# Patient Record
Sex: Female | Born: 1965
Health system: Southern US, Community
[De-identification: ages and names within clinical notes are randomized; demographics above are authoritative.]

## PROBLEM LIST (undated history)

## (undated) DIAGNOSIS — R87619 Unspecified abnormal cytological findings in specimens from cervix uteri: Secondary | ICD-10-CM

## (undated) DIAGNOSIS — F419 Anxiety disorder, unspecified: Secondary | ICD-10-CM

## (undated) DIAGNOSIS — Z8616 Personal history of COVID-19: Secondary | ICD-10-CM

## (undated) DIAGNOSIS — R7309 Other abnormal glucose: Secondary | ICD-10-CM

## (undated) DIAGNOSIS — B002 Herpesviral gingivostomatitis and pharyngotonsillitis: Secondary | ICD-10-CM

## (undated) DIAGNOSIS — IMO0002 Reserved for concepts with insufficient information to code with codable children: Secondary | ICD-10-CM

## (undated) DIAGNOSIS — E7800 Pure hypercholesterolemia, unspecified: Secondary | ICD-10-CM

## (undated) HISTORY — DX: Unspecified abnormal cytological findings in specimens from cervix uteri: R87.619

## (undated) HISTORY — DX: Pure hypercholesterolemia, unspecified: E78.00

## (undated) HISTORY — DX: Anxiety disorder, unspecified: F41.9

## (undated) HISTORY — PX: GYNECOLOGIC CRYOSURGERY: SHX857

## (undated) HISTORY — DX: Reserved for concepts with insufficient information to code with codable children: IMO0002

## (undated) HISTORY — DX: Other abnormal glucose: R73.09

## (undated) HISTORY — DX: Personal history of COVID-19: Z86.16

## (undated) HISTORY — PX: ECTOPIC PREGNANCY SURGERY: SHX613

## (undated) HISTORY — DX: Herpesviral gingivostomatitis and pharyngotonsillitis: B00.2

---

## 1998-06-09 ENCOUNTER — Other Ambulatory Visit: Admission: RE | Admit: 1998-06-09 | Discharge: 1998-06-09 | Payer: Self-pay | Admitting: *Deleted

## 1998-06-10 ENCOUNTER — Encounter: Admission: RE | Admit: 1998-06-10 | Discharge: 1998-09-08 | Payer: Self-pay | Admitting: *Deleted

## 1998-07-20 ENCOUNTER — Inpatient Hospital Stay (HOSPITAL_COMMUNITY): Admission: AD | Admit: 1998-07-20 | Discharge: 1998-07-23 | Payer: Self-pay | Admitting: *Deleted

## 1998-07-25 ENCOUNTER — Encounter (HOSPITAL_COMMUNITY): Admission: RE | Admit: 1998-07-25 | Discharge: 1998-10-23 | Payer: Self-pay | Admitting: *Deleted

## 1998-08-11 ENCOUNTER — Other Ambulatory Visit: Admission: RE | Admit: 1998-08-11 | Discharge: 1998-08-11 | Payer: Self-pay | Admitting: *Deleted

## 1999-09-06 ENCOUNTER — Other Ambulatory Visit: Admission: RE | Admit: 1999-09-06 | Discharge: 1999-09-06 | Payer: Self-pay | Admitting: Obstetrics and Gynecology

## 1999-11-21 HISTORY — PX: PELVIC LAPAROSCOPY: SHX162

## 2000-05-24 ENCOUNTER — Encounter (INDEPENDENT_AMBULATORY_CARE_PROVIDER_SITE_OTHER): Payer: Self-pay

## 2000-05-24 ENCOUNTER — Ambulatory Visit (HOSPITAL_COMMUNITY): Admission: AD | Admit: 2000-05-24 | Discharge: 2000-05-24 | Payer: Self-pay | Admitting: Obstetrics and Gynecology

## 2000-05-24 ENCOUNTER — Encounter: Payer: Self-pay | Admitting: Obstetrics and Gynecology

## 2000-11-20 HISTORY — PX: TUBAL LIGATION: SHX77

## 2000-11-27 ENCOUNTER — Other Ambulatory Visit: Admission: RE | Admit: 2000-11-27 | Discharge: 2000-11-27 | Payer: Self-pay | Admitting: Obstetrics and Gynecology

## 2001-10-15 ENCOUNTER — Encounter (INDEPENDENT_AMBULATORY_CARE_PROVIDER_SITE_OTHER): Payer: Self-pay

## 2001-10-15 ENCOUNTER — Inpatient Hospital Stay (HOSPITAL_COMMUNITY): Admission: AD | Admit: 2001-10-15 | Discharge: 2001-10-18 | Payer: Self-pay | Admitting: Obstetrics and Gynecology

## 2003-04-09 ENCOUNTER — Other Ambulatory Visit: Admission: RE | Admit: 2003-04-09 | Discharge: 2003-04-09 | Payer: Self-pay | Admitting: Obstetrics and Gynecology

## 2004-04-21 ENCOUNTER — Other Ambulatory Visit: Admission: RE | Admit: 2004-04-21 | Discharge: 2004-04-21 | Payer: Self-pay | Admitting: Obstetrics and Gynecology

## 2004-05-30 ENCOUNTER — Encounter: Admission: RE | Admit: 2004-05-30 | Discharge: 2004-05-30 | Payer: Self-pay | Admitting: Obstetrics and Gynecology

## 2005-05-09 ENCOUNTER — Other Ambulatory Visit: Admission: RE | Admit: 2005-05-09 | Discharge: 2005-05-09 | Payer: Self-pay | Admitting: Obstetrics and Gynecology

## 2005-06-28 ENCOUNTER — Encounter: Admission: RE | Admit: 2005-06-28 | Discharge: 2005-06-28 | Payer: Self-pay | Admitting: Obstetrics and Gynecology

## 2006-06-27 ENCOUNTER — Other Ambulatory Visit: Admission: RE | Admit: 2006-06-27 | Discharge: 2006-06-27 | Payer: Self-pay | Admitting: Obstetrics & Gynecology

## 2006-06-29 ENCOUNTER — Encounter: Admission: RE | Admit: 2006-06-29 | Discharge: 2006-06-29 | Payer: Self-pay | Admitting: Obstetrics and Gynecology

## 2007-01-01 ENCOUNTER — Ambulatory Visit: Payer: Self-pay | Admitting: Family Medicine

## 2007-07-17 ENCOUNTER — Other Ambulatory Visit: Admission: RE | Admit: 2007-07-17 | Discharge: 2007-07-17 | Payer: Self-pay | Admitting: Obstetrics and Gynecology

## 2007-07-31 ENCOUNTER — Encounter: Admission: RE | Admit: 2007-07-31 | Discharge: 2007-07-31 | Payer: Self-pay | Admitting: Obstetrics and Gynecology

## 2008-08-05 ENCOUNTER — Other Ambulatory Visit: Admission: RE | Admit: 2008-08-05 | Discharge: 2008-08-05 | Payer: Self-pay | Admitting: Obstetrics and Gynecology

## 2008-09-24 ENCOUNTER — Encounter: Admission: RE | Admit: 2008-09-24 | Discharge: 2008-09-24 | Payer: Self-pay | Admitting: Obstetrics and Gynecology

## 2009-10-19 ENCOUNTER — Encounter: Admission: RE | Admit: 2009-10-19 | Discharge: 2009-10-19 | Payer: Self-pay | Admitting: Obstetrics and Gynecology

## 2010-12-11 ENCOUNTER — Encounter: Payer: Self-pay | Admitting: Obstetrics and Gynecology

## 2010-12-21 ENCOUNTER — Encounter: Payer: Self-pay | Admitting: Obstetrics and Gynecology

## 2011-04-07 NOTE — Op Note (Signed)
[facility]  Patient:    Abigail Sawyer, Abigail Sawyer                       MRN: [id number] Proc. Date: 05/24/00 Adm. Date:  [id number] Attending:  Maxie Better                           Operative Report  PREOPERATIVE DIAGNOSIS:       Ruptured right ectopic pregnancy.  POSTOPERATIVE DIAGNOSIS:      Ruptured right ectopic pregnancy.  OPERATION:                    Operative laparoscopy.                               Distal right salpingectomy.  SURGEON:                      Sheronette A. Cherly Hensen, M.D.  ASSISTANT:                    Cordelia Pen A. Rosalio Macadamia, M.D.  ANESTHESIA:                   General.  INDICATIONS: This is a 45 year old gravida 3, para 1, female who presented with complaints of right lower quadrant pain and a positive pregnancy test. On evaluation, she was found to have fluid in the abdomen and an hCG of greater than 10,000 without an intrauterine pregnancy. Diagnosis of a presumptive ruptured ectopic was made. Consent was signed. The patient was transferred to the operating room for laparoscopy and possible laparotomy with possible loss of her involved tube. The risks and benefits of the procedure have been explained to the patient and her husband. Consent was signed.  DESCRIPTION OF PROCEDURE: Under adequate general anesthesia, the patient was placed in the dorsal lithotomy position. She was sterilely prepped and draped in the usual sterile fashion. An indwelling Foley catheter was placed. A bivalve speculum was placed in the vagina. An apparatus to manipulate the uterus was attached to the cervix. The speculum was removed.  Attention was then turned to the abdomen and the infraumbilical incision was made. The Veress needle as introduced into the abdomen. Opening pressure of 4 was noted. Normal saline was used to check for placement. Then 3.7 L of CO2 was insufflated. The Veress needle was removed. A 10/11 mm disposable trocar with sleeve  was introduced into the abdominal cavity without difficulty. The trocar was replaced by a lighted video laparoscope. Panoramic view of the pelvis notable for blood in the pelvis. Two additional ports were then placed. A 10 mm port was placed in the right lower quadrant and just a little bit off to the left of the midline, a 5 mm port was introduced. Through the lower ports, the large suction apparatus was then used to remove the clotted material, at which time the patient was placed in the Trendelenburg position and with manipulation of the vaginal apparatus, better visualization was noted of the pelvic contents. With the use of a probe through the left lower quadrant, the pelvis was inspected. The left tube and ovary were noted to be normal. The right ovary was noted to be normal. The distal two-thirds of the fallopian tube was distended and even more so of the distal end of the tube consistent with an ectopic  pregnancy. The inspection of the upper abdomen showed a normal liver edge. After much consideration, decision was then made to remove the distal portion of the involved tube and with a tripolar cautery, as well as the Klepinger, the distal portion of the fallopian tube containing the ectopic pregnancy was removed. The remaining edge from which the tube was removed was cauterized. Good hemostasis was noted. Using an endobag, the products of conception within the tube, as well as the tube were removed through the right lower quadrant incision. Reinspection of the pelvic content and additional suction showed good hemostasis. The appendix was then identified and noted to be normal. With good hemostasis noted, the uterus was also further inspected. A question of fibroids posteriorly, but otherwise unremarkable uterus. The lower ports were removed. The abdomen was desufflated. The infraumbilical port was then removed, taking care not to bring up any underlying structure. Then 0.25% plain  Marcaine was injected in the skin over the incision. The 10 mm ports fascia area was closed with figure-of-eight sutures of 0 Vicryl. The skin was approximated using 4-0 Vicryl.  Attention was turned to the vagina where the apparatus were removed. The specimen of right tube with ectopic pregnancy was sent to pathology. The estimated blood loss was 50 cc. The hemoperitoneum was 350 cc. Intraoperative fluid was 600 cc of crystalloid. Urine output was 100 cc. Instrument count x 2 was correct. No complications. Maternal blood type is A+. The patient tolerated the procedure well. Transferred to the recovery room in stable condition. DD:  05/24/00 TD:  05/25/00 Job: [id number]

## 2011-04-07 NOTE — Op Note (Signed)
[facility]  Patient:    Abigail Sawyer, Abigail Sawyer Visit Number: [id number] MRN: [id number]          Service Type: OBS Location: 910A 9138 [id number] Attending Physician:  Lenoard Aden Dictated by:   Lenoard Aden, M.D. Proc. Date: 10/16/01 Admit Date:  10/15/2001                             Operative Report  PREOPERATIVE DIAGNOSES:       Failure to descend, 39 week intrauterine pregnancy, presumed macrosomia, desire for elective sterilization.  POSTOPERATIVE DIAGNOSES:      Failure to descend, 39 week intrauterine pregnancy, presumed macrosomia, desire for elective sterilization.  PROCEDURE:                    Primary low segment transverse cesarean section, left partial salpingectomy.  SURGEON:                      Lenoard Aden, M.D.  ASSISTANT:                    Cordelia Pen A. Rosalio Macadamia, M.D.  ESTIMATED BLOOD LOSS:         1000 cc.  ANESTHESIA:                   Epidural by Lonon.  COMPLICATIONS:                None.  FINDINGS:                     Full-term living female.  Apgars 8 and 9. Placenta manually intact.  Three vessel cord.  No complications.  Patient to recovery in good condition.  All counts correct.  OPERATIVE NOTE:               After being apprised of the risks of anesthesia, infection, bleeding, injury to abdominal organs with need for repair, patient was brought to the operating room where she was administered dosing of her epidural anesthetic without complications.  Prepped and draped in the usual sterile fashion.  Foley catheter placed.  After achieving adequate anesthesia and apprising the patient of failure risks of tubal ligation of 5-10 per 1000 she is brought to the operating room as noted.  After achieving adequate anesthesia, Pfannenstiel skin incision made with the scalpel, carried down to the fascia which was nicked in the midline, opened transversely using Mayo scissors.  Rectus muscles dissected sharply in the  midline.  Bladder blade placed.  Visceroperitoneum scored in a smile like fashion.  Uterus scored in a smile like fashion.  Atraumatic delivery of an 8 pound 15 ounce female handed to pediatricians in attendance.  Apgars 8 and 9.  Placenta delivered manually intact.  Three vessel cord noted.  Uterus exteriorized.  Absent right tube. Normal left tube.  Normal ovaries noted.  At this time the uterus was curetted using a dry lap pack and closed using 0 Monocryl in a continuous running fashion.  Second embrocating layer placed.  Small left lateral hematoma controlled using the Monocryl suture.  No expansion noted.  At this time the bladder flap was inspected and found to be hemostatic.  Left tube was traced out to the fimbriated end, grasped in the ampullary isthmic portion. Avascular portion of the mesosalpinx is cauterized.  Plain ties placed distally and proximally.  Tubal segment excised.  Tubal lumens are cauterized and visualized.  Good hemostasis is noted.  At this time the uterus is reinspected.  Hemostasis along the incision is noted.  Irrigation is accomplished.  Uterus is replaced in the abdominal cavity where reinspection reveals good hemostasis.  At this time pericolic gutters irrigated.  All blood clots subsequently removed.  Bladder flap inspected.  Rectus muscles inspected.  Fascia closed using 0 Vicryl in a continuous running fashion. Skin closed using staples.  Dilute Marcaine solution placed 10 cc.  Patient tolerates the procedure well.  To recovery in good condition. Dictated by:   Lenoard Aden, M.D. Attending Physician:  Lenoard Aden DD:  10/16/01 TD:  10/16/01 Job: [id number]

## 2011-04-07 NOTE — Discharge Summary (Signed)
[facility]  Patient:    Abigail Sawyer, Abigail Sawyer Visit Number: [id number] MRN: [id number]          Service Type: OBS Location: 910A 9138 01 Attending Physician:  Lenoard Aden Dictated by:   Lenoard Aden, M.D. Admit Date:  10/15/2001 Discharge Date: 10/18/2001                             Discharge Summary  HOSPITAL COURSE:              The patient underwent uncomplicated primary low segment transverse cesarean section for failure to descend with cesarean section and left salpingectomy done on October 16, 2001.  Desire for elective sterilization noted.  Postoperative course uncomplicated.  Hemoglobin of 8.0. Tolerated a regular diet well and remained afebrile.  Discharged home on day three.  DISCHARGE MEDICATIONS:        Iron and prenatal vitamins.  The patient to                               take iron b.i.d.  Motrin and Tylox were                               given.  DISCHARGE INSTRUCTIONS:       Discharge teaching done.  Incision care                               discussed.  FOLLOW-UP:                    In the office in four to six weeks. Dictated by:   Lenoard Aden, M.D. Attending Physician:  Lenoard Aden DD:  10/30/01 TD:  10/31/01 Job: [id number]

## 2011-04-07 NOTE — H&P (Signed)
[facility]  Patient:    Abigail Sawyer, Abigail Sawyer Visit Number: [id number] MRN: [id number]          Service Type: Attending:  Lenoard Aden, M.D. Dictated by:   Lenoard Aden, M.D. Adm. Date:  10/15/01                           History and Physical  CHIEF COMPLAINT:              Presumed macrosomia, with polyhydramnios.  BRIEF MEDICAL HISTORY:        The patient is a 45 year old white female, G4, P1, with an EDD of October 18, 2001 at 39+ weeks; for induction for the aforementioned indications.  PAST MEDICAL HISTORY:         1. Spontaneous pregnancy loss 1998.                               2. An 8 pound 6 ounce female delivered 1999.                               3. Right ectopic with rupture and distal right                                  salpingectomy in July 2001.                               4. Pyelonephritis at age 40.  FAMILY HISTORY:               Bone cancer, depression and hypertension.  PAST SURGICAL HISTORY:        Cryosurgery 1991.  PRENATAL LAB DATA:            Blood type A positive.  Rh antibody negative. Rubella immune.  Hepatitis B surface antigen negative.  HIV nonreactive.  GC and chlamydia negative.  GBS positive.  PRENATAL COURSE:              Complicated by preterm cervical change, advanced maternal age; with normal amniocentesis with polyhydramnios with presumed macrosomia.  PHYSICAL EXAMINATION:  GENERAL:                      She is a well-developed, well-nourished white female; in no apparent distress.  HEENT:                        Normal.  LUNGS:                        Clear.  HEART:                        Regular rate and rhythm.  ABDOMEN:                      Soft, gravid, nontender.  Estimated fetal weight 8.5-9 pounds.  GENITOURINARY:                Cervix 2-3 cm dilated, 50% vertex, minus 2. Artificial rupture of membranes clear.  EXTREMITIES:  No cords.  NEUROLOGIC:                    Nonfocal.  IMPRESSION:                   Term intrauterine pregnancy, with estimated fetal weight greater than 90th percentile and mild polyhydramnios.  PLAN:                         To proceed with Pitocin induction. Dictated by:   Lenoard Aden, M.D. Attending:  Lenoard Aden, M.D. DD:  10/15/01 TD:  10/15/01 Job: [id number]

## 2011-10-03 ENCOUNTER — Ambulatory Visit
Admission: RE | Admit: 2011-10-03 | Discharge: 2011-10-03 | Disposition: A | Discharge status: Home / Self Care | Payer: 59 | Source: Ambulatory Visit | Attending: Obstetrics and Gynecology | Admitting: Obstetrics and Gynecology

## 2011-10-03 ENCOUNTER — Other Ambulatory Visit: Payer: Self-pay | Admitting: Obstetrics and Gynecology

## 2011-10-03 ENCOUNTER — Ambulatory Visit: Payer: Self-pay

## 2011-10-03 DIAGNOSIS — Z1231 Encounter for screening mammogram for malignant neoplasm of breast: Secondary | ICD-10-CM

## 2012-10-08 LAB — HM PAP SMEAR: HM Pap smear: NEGATIVE

## 2012-11-07 ENCOUNTER — Other Ambulatory Visit: Payer: Self-pay | Admitting: Obstetrics and Gynecology

## 2012-11-07 DIAGNOSIS — Z1231 Encounter for screening mammogram for malignant neoplasm of breast: Secondary | ICD-10-CM

## 2012-12-13 ENCOUNTER — Ambulatory Visit: Payer: 59

## 2013-08-19 ENCOUNTER — Other Ambulatory Visit: Payer: Self-pay | Admitting: Gynecology

## 2013-08-19 DIAGNOSIS — Z1231 Encounter for screening mammogram for malignant neoplasm of breast: Secondary | ICD-10-CM

## 2013-08-21 ENCOUNTER — Ambulatory Visit: Payer: 59

## 2013-09-16 ENCOUNTER — Other Ambulatory Visit: Payer: Self-pay | Admitting: *Deleted

## 2013-09-16 NOTE — Telephone Encounter (Signed)
Patient asking for refill on Zaleplon 10mg  Rx given 10/08/12 #30 with 3 refills And Clonazepam 0.5mg  #15 with 1 refill 10/08/12 Dr. Tresa Res.  Annual Exam scheduled for 10/10/13.

## 2013-09-18 NOTE — Telephone Encounter (Signed)
I need this chart please

## 2013-09-19 MED ORDER — ZALEPLON 10 MG PO CAPS: ORAL_CAPSULE | ORAL | Status: DC

## 2013-09-19 MED ORDER — CLONAZEPAM 0.5 MG PO TABS: ORAL_TABLET | ORAL | Status: DC

## 2013-09-19 NOTE — Telephone Encounter (Signed)
RX's called to Renae Fickle at [facility].

## 2013-10-10 ENCOUNTER — Ambulatory Visit: Payer: Self-pay | Admitting: Obstetrics and Gynecology

## 2013-10-10 ENCOUNTER — Telehealth: Payer: Self-pay | Admitting: Obstetrics and Gynecology

## 2013-10-10 NOTE — Telephone Encounter (Signed)
Patient canceled her appointment today due to emergency at work.(no more details given). Patient says " I am very sorry I have to cancel at the last minute". Patient  Rescheduled to 11/24/12 @11 :30.

## 2013-11-24 ENCOUNTER — Encounter: Payer: Self-pay | Admitting: Obstetrics and Gynecology

## 2013-11-24 ENCOUNTER — Ambulatory Visit (INDEPENDENT_AMBULATORY_CARE_PROVIDER_SITE_OTHER): Payer: 59 | Admitting: Obstetrics and Gynecology

## 2013-11-24 VITALS — BP 104/66 | HR 64 | Resp 16 | Ht 65.5 in | Wt 177.6 lb

## 2013-11-24 DIAGNOSIS — Z01419 Encounter for gynecological examination (general) (routine) without abnormal findings: Secondary | ICD-10-CM

## 2013-11-24 DIAGNOSIS — Z0000 Encounter for general adult medical examination without abnormal findings: Secondary | ICD-10-CM

## 2013-11-24 LAB — CBC
HEMATOCRIT: 40.1 % (ref 36.0–46.0)
HEMOGLOBIN: 13.7 g/dL (ref 12.0–15.0)
MCH: 30.9 pg (ref 26.0–34.0)
MCHC: 34.2 g/dL (ref 30.0–36.0)
MCV: 90.5 fL (ref 78.0–100.0)
Platelets: 220 10*3/uL (ref 150–400)
RBC: 4.43 MIL/uL (ref 3.87–5.11)
RDW: 12.4 % (ref 11.5–15.5)
WBC: 6 10*3/uL (ref 4.0–10.5)

## 2013-11-24 LAB — POCT URINALYSIS DIPSTICK
Bilirubin, UA: NEGATIVE
Blood, UA: NEGATIVE
Glucose, UA: NEGATIVE
Ketones, UA: NEGATIVE
LEUKOCYTES UA: NEGATIVE
Nitrite, UA: NEGATIVE
PH UA: 5
PROTEIN UA: NEGATIVE
UROBILINOGEN UA: NEGATIVE

## 2013-11-24 LAB — LIPID PANEL
CHOL/HDL RATIO: 3.4 ratio
Cholesterol: 206 mg/dL — ABNORMAL HIGH (ref 0–200)
HDL: 60 mg/dL (ref >39)
LDL CALC: 128 mg/dL — AB (ref 0–99)
Triglycerides: 92 mg/dL (ref <150)
VLDL: 18 mg/dL (ref 0–40)

## 2013-11-24 LAB — COMPREHENSIVE METABOLIC PANEL
ALBUMIN: 4.3 g/dL (ref 3.5–5.2)
ALK PHOS: 62 U/L (ref 39–117)
ALT: 23 U/L (ref 0–35)
AST: 21 U/L (ref 0–37)
BUN: 12 mg/dL (ref 6–23)
CALCIUM: 8.8 mg/dL (ref 8.4–10.5)
CHLORIDE: 103 meq/L (ref 96–112)
CO2: 27 mEq/L (ref 19–32)
Creat: 0.73 mg/dL (ref 0.50–1.10)
Glucose, Bld: 92 mg/dL (ref 70–99)
POTASSIUM: 4.3 meq/L (ref 3.5–5.3)
SODIUM: 139 meq/L (ref 135–145)
TOTAL PROTEIN: 7 g/dL (ref 6.0–8.3)
Total Bilirubin: 0.4 mg/dL (ref 0.3–1.2)

## 2013-11-24 LAB — HEMOGLOBIN, FINGERSTICK: HEMOGLOBIN, FINGERSTICK: 14 g/dL (ref 12.0–16.0)

## 2013-11-24 LAB — TSH: TSH: 1.454 u[IU]/mL (ref 0.350–4.500)

## 2013-11-24 NOTE — Progress Notes (Signed)
GYNECOLOGY VISIT  PCP: none  Referring provider:   HPI: 48 y.o.   Married  Caucasian  female   [phone] with Patient's last menstrual period was 05/20/2013.   here for AEX   Did a provera challenge in April 2014 and had a cycle. Having hot flashes and night sweats. Manageable.  Cognitive changes.  Considering HRT.  Hgb: 14.0  Urine:  negative  GYNECOLOGIC HISTORY: Patient's last menstrual period was 05/20/2013. Sexually active:  yes Partner preference: female Contraception:  BTL  Menopausal hormone therapy:none  DES exposure:  no  Blood transfusions: no   Sexually transmitted diseases: fever blister   GYN Procedures:  Pelvic laparoscopy, BTL, cryosurgery Mammogram: 10/03/11 normal                Pap:  10/08/12 WNL/negative HR HPV  History of abnormal pap smear: yes    OB History   Grav Para Term Preterm Abortions TAB SAB Ect Mult Living   4 2 2  2  1 1  2        LIFESTYLE: Exercise:  Tennis and cardio             Tobacco: none Alcohol: yes 4 drinks/week Drug use: none   OTHER HEALTH MAINTENANCE: Tetanus/TDap:5/04 Gardisil: no Influenza:  2013 Zostavax: no  Bone density:none Colonoscopy:none  Cholesterol check: no  No family history on file.  There are no active problems to display for this patient.  Past Medical History  Diagnosis Date  . Primary HSV infection of mouth   . Anxiety     Past Surgical History  Procedure Laterality Date  . Pelvic laparoscopy  2001    LSO  . Tubal ligation  2002  . Cesarean section  2002    ALLERGIES: Review of patient's allergies indicates no known allergies.  Current Outpatient Prescriptions  Medication Sig Dispense Refill  . clonazePAM (KLONOPIN) 0.5 MG tablet Take 1/2 tablet by mouth up to twice daily  15 tablet  0  . valACYclovir (VALTREX) 1000 MG tablet Take 1,000 mg by mouth as needed.      . zaleplon (SONATA) 10 MG capsule Take 1 capsule by mouth daily at bedtime as needed.  30 capsule  0   No current  facility-administered medications for this visit.     ROS:  Pertinent items are noted in HPI.  SOCIAL HISTORY:  Plays tennis.   PHYSICAL EXAMINATION:    BP 104/66  Pulse 64  Resp 16  Ht 5' 5.5" (1.664 m)  Wt 177 lb 9.6 oz (80.559 kg)  BMI 29.09 kg/m2  LMP 05/20/2013   Wt Readings from Last 3 Encounters:  11/24/13 177 lb 9.6 oz (80.559 kg)     Ht Readings from Last 3 Encounters:  11/24/13 5' 5.5" (1.664 m)    General appearance: alert, cooperative and appears stated age Head: Normocephalic, without obvious abnormality, atraumatic Neck: no adenopathy, supple, symmetrical, trachea midline and thyroid not enlarged, symmetric, no tenderness/mass/nodules Lungs: clear to auscultation bilaterally Breasts: Inspection negative, No nipple retraction or dimpling, No nipple discharge or bleeding, No axillary or supraclavicular adenopathy, Normal to palpation without dominant masses Heart: regular rate and rhythm Abdomen: soft, non-tender; no masses,  no organomegaly Extremities: extremities normal, atraumatic, no cyanosis or edema Skin: Skin color, texture, turgor normal. No rashes or lesions Lymph nodes: Cervical, supraclavicular, and axillary nodes normal. No abnormal inguinal nodes palpated Neurologic: Grossly normal  Pelvic: External genitalia:  no lesions  Urethra:  normal appearing urethra with no masses, tenderness or lesions              Bartholins and Skenes: normal                 Vagina: normal appearing vagina with normal color and discharge, no lesions              Cervix: normal appearance              Pap and high risk HPV testing done: no.            Bimanual Exam:  Uterus:  uterus is normal size, shape, consistency and nontender                                      Adnexa: normal adnexa in size, nontender and no masses                                      Rectovaginal: Confirms                                      Anus:  normal sphincter tone, no  lesions  ASSESSMENT  Normal gynecologic exam. Perimenopausal female. History of oral HSV.  Valtrex prn. Has Rx.  PLAN  Mammogram yearly.  [facility]  Pap smear and high risk HPV testing not indicated. Counseled on use and side effects of HR.  Risks and benefits reviewed.  Risk of thromboembolic phenomena and breast cancer discussed.  Written material also to patient.  FLP, CMP, TSH, CBC Return annually or prn   An After Visit Summary was printed and given to the patient.

## 2013-11-24 NOTE — Patient Instructions (Signed)
EXERCISE AND DIET:  We recommended that you start or continue a regular exercise program for good health. Regular exercise means any activity that makes your heart beat faster and makes you sweat.  We recommend exercising at least 30 minutes per day at least 3 days a week, preferably 4 or 5.  We also recommend a diet low in fat and sugar.  Inactivity, poor dietary choices and obesity can cause diabetes, heart attack, stroke, and kidney damage, among others.    ALCOHOL AND SMOKING:  Women should limit their alcohol intake to no more than 7 drinks/beers/glasses of wine (combined, not each!) per week. Moderation of alcohol intake to this level decreases your risk of breast cancer and liver damage. And of course, no recreational drugs are part of a healthy lifestyle.  And absolutely no smoking or even second hand smoke. Most people know smoking can cause heart and lung diseases, but did you know it also contributes to weakening of your bones? Aging of your skin?  Yellowing of your teeth and nails?  CALCIUM AND VITAMIN D:  Adequate intake of calcium and Vitamin D are recommended.  The recommendations for exact amounts of these supplements seem to change often, but generally speaking 600 mg of calcium (either carbonate or citrate) and 800 units of Vitamin D per day seems prudent. Certain women may benefit from higher intake of Vitamin D.  If you are among these women, your doctor will have told you during your visit.    PAP SMEARS:  Pap smears, to check for cervical cancer or precancers,  have traditionally been done yearly, although recent scientific advances have shown that most women can have pap smears less often.  However, every woman still should have a physical exam from her gynecologist every year. It will include a breast check, inspection of the vulva and vagina to check for abnormal growths or skin changes, a visual exam of the cervix, and then an exam to evaluate the size and shape of the uterus and  ovaries.  And after 50 years of age, a rectal exam is indicated to check for rectal cancers. We will also provide age appropriate advice regarding health maintenance, like when you should have certain vaccines, screening for sexually transmitted diseases, bone density testing, colonoscopy, mammograms, etc.   MAMMOGRAMS:  All women over 40 years old should have a yearly mammogram. Many facilities now offer a "3D" mammogram, which may cost around $50 extra out of pocket. If possible,  we recommend you accept the option to have the 3D mammogram performed.  It both reduces the number of women who will be called back for extra views which then turn out to be normal, and it is better than the routine mammogram at detecting truly abnormal areas.    COLONOSCOPY:  Colonoscopy to screen for colon cancer is recommended for all women at age 50.  We know, you hate the idea of the prep.  We agree, BUT, having colon cancer and not knowing it is worse!!  Colon cancer so often starts as a polyp that can be seen and removed at colonscopy, which can quite literally save your life!  And if your first colonoscopy is normal and you have no family history of colon cancer, most women don't have to have it again for 10 years.  Once every ten years, you can do something that may end up saving your life, right?  We will be happy to help you get it scheduled when you are ready.    Be sure to check your insurance coverage so you understand how much it will cost.  It may be covered as a preventative service at no cost, but you should check your particular policy.     Estradiol skin patches What is this medicine? ESTRADIOL (es tra DYE ole) skin patches contain an estrogen. It is mostly used as hormone replacement in menopausal women. It helps to treat hot flashes and prevent osteoporosis. It is also used to treat women with low estrogen levels or those who have had their ovaries removed. This medicine may be used for other purposes; ask  your health care provider or pharmacist if you have questions. COMMON BRAND NAME(S): Alora, Climara, Esclim, Estraderm, FemPatch, Menostar, Minivelle, Vivelle, Vivelle-Dot What should I tell my health care provider before I take this medicine? They need to know if you have any of these conditions: -abnormal vaginal bleeding -blood vessel disease or blood clots -breast, cervical, endometrial, ovarian, liver, or uterine cancer -dementia -diabetes -gallbladder disease -heart disease or recent heart attack -high blood pressure -high cholesterol -high level of calcium in the blood -hysterectomy -kidney disease -liver disease -migraine headaches -protein C deficiency -protein S deficiency -stroke -systemic lupus erythematosus (SLE) -tobacco smoker -an unusual or allergic reaction to estrogens, other hormones, medicines, foods, dyes, or preservatives -pregnant or trying to get pregnant -breast-feeding How should I use this medicine? This medicine is for external use only. Follow the directions on the prescription label. Tear open the pouch, do not use scissors. Remove the stiff protective liner covering the adhesive. Try not to touch the adhesive. Apply the patch, sticky side to the skin, to an area that is clean, dry and hairless. Avoid injured, irritated, calloused, or scarred areas. Do not apply the skin patches to your breasts or around the waistline. Use a different site each time to prevent skin irritation. Do not cut or trim the patch. Do not stop using except on the advice of your doctor or health care professional. Do not wear more than one patch at a time unless you are told to do so by your doctor or health care professional. Contact your pediatrician regarding the use of this medicine in children. Special care may be needed. A patient package insert for the product will be given with each prescription and refill. Read this sheet carefully each time. The sheet may change  frequently. Overdosage: If you think you have taken too much of this medicine contact a poison control center or emergency room at once. NOTE: This medicine is only for you. Do not share this medicine with others. What if I miss a dose? If you miss a dose, apply it as soon as you can. If it is almost time for your next dose, apply only that dose. Do not apply double or extra doses. What may interact with this medicine? Do not take this medicine with any of the following medications: -aromatase inhibitors like aminoglutethimide, anastrozole, exemestane, letrozole, testolactone This medicine may also interact with the following medications: -carbamazepine -certain antibiotics used to treat infections -certain barbiturates used for inducing sleep or treating seizures -grapefruit juice -medicines for fungus infections like itraconazole and ketoconazole -raloxifene or tamoxifen -rifabutin, rifampin, or rifapentine -ritonavir -St. John's Wort This list may not describe all possible interactions. Give your health care provider a list of all the medicines, herbs, non-prescription drugs, or dietary supplements you use. Also tell them if you smoke, drink alcohol, or use illegal drugs. Some items may interact with your medicine. What should  I watch for while using this medicine? Visit your doctor or health care professional for regular checks on your progress. You will need a regular breast and pelvic exam and Pap smear while on this medicine. You should also discuss the need for regular mammograms with your health care professional, and follow his or her guidelines for these tests. This medicine can make your body retain fluid, making your fingers, hands, or ankles swell. Your blood pressure can go up. Contact your doctor or health care professional if you feel you are retaining fluid. If you have any reason to think you are pregnant, stop taking this medicine right away and contact your doctor or health  care professional. Smoking increases the risk of getting a blood clot or having a stroke while you are taking this medicine, especially if you are more than 50 years old. You are strongly advised not to smoke. If you wear contact lenses and notice visual changes, or if the lenses begin to feel uncomfortable, consult your eye doctor or health care professional. This medicine can increase the risk of developing a condition (endometrial hyperplasia) that may lead to cancer of the lining of the uterus. Taking progestins, another hormone drug, with this medicine lowers the risk of developing this condition. Therefore, if your uterus has not been removed (by a hysterectomy), your doctor may prescribe a progestin for you to take together with your estrogen. You should know, however, that taking estrogens with progestins may have additional health risks. You should discuss the use of estrogens and progestins with your health care professional to determine the benefits and risks for you. If you are going to have surgery or an MRI, you may need to stop taking this medicine. Consult your health care professional for advice before you schedule the surgery. You may bathe or participate in other activities while wearing your patch. If the patch pulls loose or falls off, you may reapply it if the patch is sticky enough to stay on the skin. You should reapply the patch in a different area. Use a fresh patch if it will no longer stick. What side effects may I notice from receiving this medicine? Side effects that you should report to your doctor or health care professional as soon as possible: -allergic reactions like skin rash, itching or hives, swelling of the face, lips, or tongue -breast tissue changes or discharge -changes in vision -chest pain -confusion, trouble speaking or understanding -dark urine -general ill feeling or flu-like symptoms -light-colored stools -nausea, vomiting -pain, swelling, warmth in  the leg -right upper belly pain -severe headaches -shortness of breath -sudden numbness or weakness of the face, arm or leg -trouble walking, dizziness, loss of balance or coordination -unusual vaginal bleeding -yellowing of the eyes or skin Side effects that usually do not require medical attention (report to your doctor or health care professional if they continue or are bothersome): -hair loss -increased hunger or thirst -increased urination -symptoms of vaginal infection like itching, irritation or unusual discharge -unusually weak or tired This list may not describe all possible side effects. Call your doctor for medical advice about side effects. You may report side effects to FDA at 1-800-FDA-1088. Where should I keep my medicine? Keep out of the reach of children. Store at room temperature below 30 degrees C (86 degrees F). Do not store any patches that have been removed from their protective pouch. Throw away any unused medicine after the expiration date. Dispose of used patches properly. Since used patches  may still contain active hormones, fold the patch in half so that it sticks to itself prior to disposal. NOTE: This sheet is a summary. It may not cover all possible information. If you have questions about this medicine, talk to your doctor, pharmacist, or health care provider.  2014, Elsevier/Gold Standard. (2011-02-08 09:19:41)  Progesterone capsules What is this medicine? PROGESTERONE (proe JES ter one) is a female hormone. This medicine is used to prevent the overgrowth of the lining of the uterus in women who are taking estrogens for the symptoms of menopause. It is also used to treat secondary amenorrhea. This is when a woman stops getting menstrual periods due to low levels of progesterone. This medicine may be used for other purposes; ask your health care provider or pharmacist if you have questions. COMMON BRAND NAME(S): Prometrium What should I tell my health care  provider before I take this medicine? They need to know if you have any of these conditions: -autoimmune disease like systemic lupus erythematosus (SLE) -blood vessel disease, blood clotting disorder, or suffered a stroke -breast, cervical or vaginal cancer -dementia -diabetes -kidney or liver disease -heart disease, high blood pressure or recent heart attack -high blood lipids or cholesterol -hysterectomy -recent miscarriage -tobacco smoker -vaginal bleeding -an unusual or allergic reaction to progesterone, peanuts, other medicines, foods, dyes, or preservatives -pregnant or trying to get pregnant -breast-feeding How should I use this medicine? Take this medicine by mouth with a glass of water. Follow the directions on the prescription label. Take your doses at regular intervals. Do not take your medicine more often than directed. Talk to your pediatrician regarding the use of this medicine in children. Special care may be needed. A patient package insert for the product will be given with each prescription and refill. Read this sheet carefully each time. The sheet may change frequently. Overdosage: If you think you have taken too much of this medicine contact a poison control center or emergency room at once. NOTE: This medicine is only for you. Do not share this medicine with others. What if I miss a dose? If you miss a dose, take it as soon as you can. If it is almost time for your next dose, take only that dose. Do not take double or extra doses. What may interact with this medicine? Do not take this medicine with any of the following medications: -bosentan This medicine may also interact with the following medications: -barbiturate medicines for sleep or seizures -bexarotene -carbamazepine -ethotoin -ketoconazole -phenytoin -rifampin This list may not describe all possible interactions. Give your health care provider a list of all the medicines, herbs, non-prescription  drugs, or dietary supplements you use. Also tell them if you smoke, drink alcohol, or use illegal drugs. Some items may interact with your medicine. What should I watch for while using this medicine? Visit your doctor or health care professional for regular checks on your progress. This medicine can cause swelling, tenderness, or bleeding of the gums. Be careful when brushing and flossing teeth. See your dentist regularly for routine dental care. You may get drowsy or dizzy. Do not drive, use machinery, or do anything that needs mental alertness until you know how this drug affects you. Do not stand or sit up quickly, especially if you are an older patient. This reduces the risk of dizzy or fainting spells. What side effects may I notice from receiving this medicine? Side effects that you should report to your doctor or health care professional as soon as  possible: -allergic reactions like skin rash, itching or hives, swelling of the face, lips, or tongue -breast tissue changes or discharge -changes in vaginal bleeding during your period or between your periods -depression -muscle or bone pain -numbness or pain in the arm or leg -pain in the chest, groin or leg -seizures or tremors -severe headache -stomach pain -sudden shortness of breath -unusually weak or tired -vision or speech problems -yellowing of skin or eyes Side effects that usually do not require medical attention (report to your doctor or health care professional if they continue or are bothersome): -acne -fluid retention and swelling -increased in appetite -mood changes, anxiety, depression, frustration, anger, or emotional outbursts -nausea, vomiting -sweating or hot flashes This list may not describe all possible side effects. Call your doctor for medical advice about side effects. You may report side effects to FDA at 1-800-FDA-1088. Where should I keep my medicine? Keep out of the reach of children. Store at room  temperature between 15 and 30 degrees C (59 and 86 degrees F). Protect from light. Keep container tightly closed. Throw away any unused medicine after the expiration date. NOTE: This sheet is a summary. It may not cover all possible information. If you have questions about this medicine, talk to your doctor, pharmacist, or health care provider.  2014, Elsevier/Gold Standard. (2008-10-22 11:43:48)

## 2013-12-22 ENCOUNTER — Ambulatory Visit
Admission: RE | Admit: 2013-12-22 | Discharge: 2013-12-22 | Disposition: A | Discharge status: Home / Self Care | Payer: 59 | Source: Ambulatory Visit | Attending: Gynecology | Admitting: Gynecology

## 2013-12-22 DIAGNOSIS — Z1231 Encounter for screening mammogram for malignant neoplasm of breast: Secondary | ICD-10-CM

## 2014-04-30 ENCOUNTER — Other Ambulatory Visit: Payer: Self-pay | Admitting: Obstetrics and Gynecology

## 2014-04-30 ENCOUNTER — Telehealth: Payer: Self-pay | Admitting: Obstetrics and Gynecology

## 2014-04-30 NOTE — Telephone Encounter (Signed)
Patient is asking for refills of clonazepam, valacyclovir and zalelpon. Please call patient when has been called in to [facility] @ [address].

## 2014-04-30 NOTE — Telephone Encounter (Signed)
Patient is asking for a refill of clonazepam, valacyclovir and zaleplon. Please call patient when prescriptions have been called to [facility] @ [address].

## 2014-05-01 ENCOUNTER — Telehealth: Payer: Self-pay | Admitting: Obstetrics and Gynecology

## 2014-05-01 MED ORDER — ZALEPLON 10 MG PO CAPS: ORAL_CAPSULE | ORAL | Status: DC

## 2014-05-01 MED ORDER — VALACYCLOVIR HCL 1 G PO TABS: 1000.0000 mg | ORAL_TABLET | ORAL | Status: DC | PRN

## 2014-05-01 MED ORDER — CLONAZEPAM 0.5 MG PO TABS: ORAL_TABLET | ORAL | Status: DC

## 2014-05-01 NOTE — Telephone Encounter (Signed)
Dr.Silva, patient is calling requesting refills of clonazepam 0.5mg , valtrex 1000mg , and sonata 10mg . Last AEX 11/2013. Last refills 08/2013. No refills left. Orders were sent through refill pool. Please review and approve or deny. Thank you.

## 2014-05-01 NOTE — Telephone Encounter (Signed)
Spoke with [facility]. Prescriptions for klonopin and sonata faxed over to [phone] and valtrex sent electronically. Spoke with patient advised rx sent. Patient agreeable and verbalizes understanding.  Routing to provider for final review. Patient agreeable to disposition. Will close encounter

## 2014-05-01 NOTE — Telephone Encounter (Signed)
Last AEX 11/2013 Last refills 08/2013 No refills left   Please approve or deny Rx.

## 2014-05-01 NOTE — Telephone Encounter (Signed)
Pt wants to speak with the nurse today.Pt is calling to find out the status on her request for 3 prescriptions.

## 2014-05-04 NOTE — Telephone Encounter (Signed)
Fax to [facility] failed on 05/01/14.  [phone].

## 2014-09-10 ENCOUNTER — Telehealth: Payer: Self-pay | Admitting: Obstetrics and Gynecology

## 2014-09-10 NOTE — Telephone Encounter (Signed)
Dr cx/rs appt/ lmtcb to rs °

## 2014-09-11 NOTE — Telephone Encounter (Signed)
RESCHEDULE APPOINTMENT  I WILL CLOSE THE ENCOUNTER.

## 2014-09-21 ENCOUNTER — Encounter: Payer: Self-pay | Admitting: Obstetrics and Gynecology

## 2014-10-12 ENCOUNTER — Other Ambulatory Visit: Payer: Self-pay

## 2014-10-12 MED ORDER — ZALEPLON 10 MG PO CAPS: ORAL_CAPSULE | ORAL | Status: DC

## 2014-10-12 NOTE — Telephone Encounter (Signed)
Incoming Refill Request from [facility] WJ:[id number]:Zaleplon 10mg   Last AEX:11/24/13 Last Refill:05/01/14 #30 X 0 Next AEX:12/23/14  (Dr. Edward JollySilva pt)  Please Advise

## 2014-10-12 NOTE — Telephone Encounter (Signed)
Rx has been faxed.

## 2014-11-30 ENCOUNTER — Ambulatory Visit: Payer: 59 | Admitting: Obstetrics and Gynecology

## 2014-12-23 ENCOUNTER — Encounter: Payer: Self-pay | Admitting: Obstetrics and Gynecology

## 2014-12-23 ENCOUNTER — Ambulatory Visit (INDEPENDENT_AMBULATORY_CARE_PROVIDER_SITE_OTHER): Payer: 59 | Admitting: Obstetrics and Gynecology

## 2014-12-23 VITALS — BP 118/80 | HR 68 | Resp 14 | Ht 65.0 in | Wt 181.2 lb

## 2014-12-23 DIAGNOSIS — Z01419 Encounter for gynecological examination (general) (routine) without abnormal findings: Secondary | ICD-10-CM

## 2014-12-23 DIAGNOSIS — N951 Menopausal and female climacteric states: Secondary | ICD-10-CM

## 2014-12-23 DIAGNOSIS — Z0000 Encounter for general adult medical examination without abnormal findings: Secondary | ICD-10-CM

## 2014-12-23 DIAGNOSIS — Z23 Encounter for immunization: Secondary | ICD-10-CM

## 2014-12-23 LAB — POCT URINALYSIS DIPSTICK
BILIRUBIN UA: NEGATIVE
Glucose, UA: NEGATIVE
Ketones, UA: NEGATIVE
Leukocytes, UA: NEGATIVE
Nitrite, UA: NEGATIVE
PH UA: 5
PROTEIN UA: NEGATIVE
RBC UA: NEGATIVE
UROBILINOGEN UA: NEGATIVE

## 2014-12-23 LAB — COMPREHENSIVE METABOLIC PANEL
ALBUMIN: 4.1 g/dL (ref 3.5–5.2)
ALK PHOS: 51 U/L (ref 39–117)
ALT: 18 U/L (ref 0–35)
AST: 22 U/L (ref 0–37)
BILIRUBIN TOTAL: 0.5 mg/dL (ref 0.2–1.2)
BUN: 16 mg/dL (ref 6–23)
CALCIUM: 9 mg/dL (ref 8.4–10.5)
CHLORIDE: 100 meq/L (ref 96–112)
CO2: 29 meq/L (ref 19–32)
Creat: 0.73 mg/dL (ref 0.50–1.10)
Glucose, Bld: 89 mg/dL (ref 70–99)
POTASSIUM: 4.2 meq/L (ref 3.5–5.3)
SODIUM: 138 meq/L (ref 135–145)
Total Protein: 7 g/dL (ref 6.0–8.3)

## 2014-12-23 LAB — CBC
HEMATOCRIT: 40.9 % (ref 36.0–46.0)
Hemoglobin: 13.6 g/dL (ref 12.0–15.0)
MCH: 30.8 pg (ref 26.0–34.0)
MCHC: 33.3 g/dL (ref 30.0–36.0)
MCV: 92.7 fL (ref 78.0–100.0)
MPV: 9.7 fL (ref 8.6–12.4)
PLATELETS: 224 10*3/uL (ref 150–400)
RBC: 4.41 MIL/uL (ref 3.87–5.11)
RDW: 12.6 % (ref 11.5–15.5)
WBC: 4.7 10*3/uL (ref 4.0–10.5)

## 2014-12-23 LAB — LIPID PANEL
CHOLESTEROL: 204 mg/dL — AB (ref 0–200)
HDL: 61 mg/dL (ref >39)
LDL Cholesterol: 128 mg/dL — ABNORMAL HIGH (ref 0–99)
Total CHOL/HDL Ratio: 3.3 Ratio
Triglycerides: 75 mg/dL (ref <150)
VLDL: 15 mg/dL (ref 0–40)

## 2014-12-23 LAB — HEMOGLOBIN, FINGERSTICK: Hemoglobin, fingerstick: 13.6 g/dL (ref 12.0–16.0)

## 2014-12-23 MED ORDER — VALACYCLOVIR HCL 1 G PO TABS: ORAL_TABLET | ORAL | Status: DC

## 2014-12-23 NOTE — Patient Instructions (Signed)

## 2014-12-23 NOTE — Progress Notes (Signed)
Patient ID: Abigail Sawyer, female   DOB: [birth year], 49 y.o.   MRN: [id number] 49 y.o. [id number] MarriedCaucasianF here for annual exam.   PCP:  None  Not as many hot flashes.  Night sweats gone for a couple of months.  No spotting at all.  Decreased libido.   Has increased anxiety in December and in the summer.  Uses Clonazepam and sleeping aid more in these times. Otherwise does not really use.  Does not need refills of theses today but wants to know if they would be available if needed.   Patient's last menstrual period was 05/20/2014 (approximate).        Prior LMP was July 2014.   Sexually active: Yes.   female partner The current method of family planning is tubal ligation.    Exercising: Yes.    tennis and cardio. Smoker:  no  Health Maintenance: Pap:  10-08-12 wnl:neg HR HPV History of abnormal Pap:  Yes. At age 89 had cryotherapy to cervix--paps normal since. MMG:  12-22-13 wnl:[facility]--Patient will call to schedule Colonoscopy:  Never BMD:    --- TDaP:  03/2003 Screening Labs: --- Hb today: 13.6, Urine today: Neg   reports that she has never smoked. She has never used smokeless tobacco. She reports that she drinks about 2.4 oz of alcohol per week. She reports that she does not use illicit drugs.  Past Medical History  Diagnosis Date  . Primary HSV infection of mouth   . Anxiety   . Abnormal Pap smear     in her 17s    Past Surgical History  Procedure Laterality Date  . Pelvic laparoscopy  2001    LSO  . Tubal ligation  2002  . Cesarean section  2002  . Gynecologic cryosurgery    . Ectopic pregnancy surgery      Current Outpatient Prescriptions  Medication Sig Dispense Refill  . clonazePAM (KLONOPIN) 0.5 MG tablet Take 1/2 tablet by mouth up to twice daily 15 tablet 0  . valACYclovir (VALTREX) 1000 MG tablet Take 1 tablet (1,000 mg total) by mouth as needed. 30 tablet 0  . zaleplon (SONATA) 10 MG capsule Take 1 capsule by mouth daily at bedtime as  needed. 30 capsule 0   No current facility-administered medications for this visit.    Family History  Problem Relation Age of Onset  . Multiple myeloma Maternal Grandmother   . Heart attack Maternal Grandfather     ROS:  Pertinent items are noted in HPI.  Otherwise, a comprehensive ROS was negative.  Exam:   BP 118/80 mmHg  Pulse 68  Resp 14  Ht '5\' 5"'  (1.651 m)  Wt 181 lb 3.2 oz (82.192 kg)  BMI 30.15 kg/m2  LMP 05/20/2014 (Approximate)    Height: '5\' 5"'  (165.1 cm)  Ht Readings from Last 3 Encounters:  12/23/14 '5\' 5"'  (1.651 m)  11/24/13 5' 5.5" (1.664 m)    General appearance: alert, cooperative and appears stated age Head: Normocephalic, without obvious abnormality, atraumatic Neck: no adenopathy, supple, symmetrical, trachea midline and thyroid normal to inspection and palpation Lungs: clear to auscultation bilaterally Breasts: normal appearance, no masses or tenderness, Inspection negative, No nipple retraction or dimpling, No nipple discharge or bleeding, No axillary or supraclavicular adenopathy Heart: regular rate and rhythm Abdomen: soft, non-tender; bowel sounds normal; no masses,  no organomegaly Extremities: extremities normal, atraumatic, no cyanosis or edema Skin: Skin color, texture, turgor normal. No rashes or lesions Lymph nodes: Cervical, supraclavicular, and axillary  nodes normal. No abnormal inguinal nodes palpated Neurologic: Grossly normal   Pelvic: External genitalia:  no lesions              Urethra:  normal appearing urethra with no masses, tenderness or lesions              Bartholins and Skenes: normal                 Vagina: normal appearing vagina with normal color and discharge, no lesions              Cervix: no lesions              Pap taken: No. Bimanual Exam:  Uterus:  normal size, contour, position, consistency, mobility, non-tender              Adnexa: no mass, fullness, tenderness               Rectovaginal: Confirms                Anus:  normal sphincter tone, no lesions  Chaperone was present for exam.  A:  Well Woman with normal exam Perimenopausal female.  Anxiety.   P:   Mammogram yearly.  pap smear next year. Will check FSH/estradiol, Lipids, CMP, CBC, TSH. Discussed HRT and decreased libido.  Will not do Rx for either at this time.  Will do colonoscopy through [facility].  She will call for appt. TDap today.  Call for refills of Sonata or Clonazepam.  Patient agrees that this is OK for her.  Follow up yearly and prn.   return annually or prn

## 2014-12-24 LAB — FOLLICLE STIMULATING HORMONE: FSH: 60.2 m[IU]/mL

## 2014-12-24 LAB — ESTRADIOL: ESTRADIOL: 20.5 pg/mL

## 2014-12-24 LAB — TSH: TSH: 0.874 u[IU]/mL (ref 0.350–4.500)

## 2015-01-05 ENCOUNTER — Other Ambulatory Visit: Payer: Self-pay

## 2015-01-05 DIAGNOSIS — Z1231 Encounter for screening mammogram for malignant neoplasm of breast: Secondary | ICD-10-CM

## 2015-01-07 ENCOUNTER — Ambulatory Visit
Admission: RE | Admit: 2015-01-07 | Discharge: 2015-01-07 | Disposition: A | Discharge status: Home / Self Care | Payer: 59 | Source: Ambulatory Visit

## 2015-01-07 DIAGNOSIS — Z1231 Encounter for screening mammogram for malignant neoplasm of breast: Secondary | ICD-10-CM

## 2015-02-08 ENCOUNTER — Other Ambulatory Visit: Payer: Self-pay | Admitting: Obstetrics and Gynecology

## 2015-02-08 NOTE — Telephone Encounter (Signed)
Medication refill request: Klonopin and Sonata Last AEX:  12/23/14 Dr. Edward JollySilva Next AEX: 12/27/15 Dr. Edward JollySilva Last MMG (if hormonal medication request): 01/07/15 [id number]:neg Refill authorized: Klonopin 05/01/14 #15tabs/0R. Sonata 10/12/14 #30caps/0R. Today please advise.

## 2015-02-08 NOTE — Telephone Encounter (Signed)
Patient calling requesting refills on clonazepam and Sonata be sent the pharmacy on file.

## 2015-02-09 MED ORDER — CLONAZEPAM 0.5 MG PO TABS: ORAL_TABLET | ORAL | Status: DC

## 2015-02-09 MED ORDER — ZALEPLON 10 MG PO CAPS: ORAL_CAPSULE | ORAL | Status: DC

## 2015-02-09 NOTE — Telephone Encounter (Signed)
I am OK with the Sonata and the Clonazepam #30 and no refills for each of these.   Yes, you are correct.  I cannot sign them as I am not in the office and was not there yesterday.  Thank you!  Cc- Dr. Hyacinth MeekerMiller

## 2015-02-09 NOTE — Telephone Encounter (Signed)
Patient is calling to check on status of refills. Routing to Dr.Miller for review and advise as Dr.Silva is out of the office today.

## 2015-02-09 NOTE — Telephone Encounter (Signed)
[facility] Patient notified.

## 2015-03-22 ENCOUNTER — Other Ambulatory Visit: Payer: Self-pay | Admitting: Gastroenterology

## 2015-05-06 ENCOUNTER — Encounter: Payer: Self-pay | Admitting: Obstetrics and Gynecology

## 2015-07-05 ENCOUNTER — Other Ambulatory Visit: Payer: Self-pay | Admitting: Obstetrics & Gynecology

## 2015-07-05 NOTE — Telephone Encounter (Signed)
Medication refill request: clonazepam 0.5 ; Zaleplon 10 mg  Last AEX:  12/23/14 with BS Next AEX: 12/27/15 with BS Last MMG (if hormonal medication request): n/a Refill authorized: Please advise

## 2015-07-07 NOTE — Telephone Encounter (Signed)
[facility] 07/06/15

## 2015-11-01 ENCOUNTER — Other Ambulatory Visit: Payer: Self-pay | Admitting: Obstetrics and Gynecology

## 2015-11-01 NOTE — Telephone Encounter (Signed)
Medication refill request: Klonopin & Sonata Last AEX:  12-23-14 Next AEX: 12-27-15 Last MMG (if hormonal medication request): 01-07-15 WNL Refill authorized: please advise

## 2015-12-23 ENCOUNTER — Other Ambulatory Visit: Payer: Self-pay | Admitting: Obstetrics & Gynecology

## 2015-12-23 NOTE — Telephone Encounter (Signed)
Medication refill request: Klonopin Last AEX:  12-23-14 Next AEX: 12-27-15   Last MMG (if hormonal medication request): 01-07-15 WNL  Refill authorized: please advise

## 2015-12-27 ENCOUNTER — Encounter: Payer: Self-pay | Admitting: Obstetrics and Gynecology

## 2015-12-27 ENCOUNTER — Ambulatory Visit (INDEPENDENT_AMBULATORY_CARE_PROVIDER_SITE_OTHER): Payer: 59 | Admitting: Obstetrics and Gynecology

## 2015-12-27 VITALS — BP 132/72 | HR 80 | Resp 16 | Ht 65.0 in | Wt 185.0 lb

## 2015-12-27 DIAGNOSIS — R7309 Other abnormal glucose: Secondary | ICD-10-CM

## 2015-12-27 DIAGNOSIS — Z0000 Encounter for general adult medical examination without abnormal findings: Secondary | ICD-10-CM

## 2015-12-27 DIAGNOSIS — G4700 Insomnia, unspecified: Secondary | ICD-10-CM | POA: Diagnosis not present

## 2015-12-27 DIAGNOSIS — Z01419 Encounter for gynecological examination (general) (routine) without abnormal findings: Secondary | ICD-10-CM

## 2015-12-27 DIAGNOSIS — Z8632 Personal history of gestational diabetes: Secondary | ICD-10-CM

## 2015-12-27 DIAGNOSIS — F411 Generalized anxiety disorder: Secondary | ICD-10-CM

## 2015-12-27 DIAGNOSIS — N951 Menopausal and female climacteric states: Secondary | ICD-10-CM

## 2015-12-27 DIAGNOSIS — K5900 Constipation, unspecified: Secondary | ICD-10-CM | POA: Diagnosis not present

## 2015-12-27 HISTORY — DX: Other abnormal glucose: R73.09

## 2015-12-27 LAB — HEMOGLOBIN A1C
HEMOGLOBIN A1C: 5.7 % — AB (ref <5.7)
MEAN PLASMA GLUCOSE: 117 mg/dL — AB (ref <117)

## 2015-12-27 LAB — COMPREHENSIVE METABOLIC PANEL
ALBUMIN: 4.3 g/dL (ref 3.6–5.1)
ALT: 19 U/L (ref 6–29)
AST: 21 U/L (ref 10–35)
Alkaline Phosphatase: 50 U/L (ref 33–130)
BUN: 18 mg/dL (ref 7–25)
CALCIUM: 9.3 mg/dL (ref 8.6–10.4)
CHLORIDE: 103 mmol/L (ref 98–110)
CO2: 29 mmol/L (ref 20–31)
Creat: 1.05 mg/dL (ref 0.50–1.05)
Glucose, Bld: 93 mg/dL (ref 65–99)
POTASSIUM: 4.5 mmol/L (ref 3.5–5.3)
Sodium: 141 mmol/L (ref 135–146)
Total Bilirubin: 0.3 mg/dL (ref 0.2–1.2)
Total Protein: 7.2 g/dL (ref 6.1–8.1)

## 2015-12-27 LAB — POCT URINALYSIS DIPSTICK
Bilirubin, UA: NEGATIVE
Glucose, UA: NEGATIVE
Ketones, UA: NEGATIVE
Leukocytes, UA: NEGATIVE
NITRITE UA: NEGATIVE
PH UA: 5
Protein, UA: NEGATIVE
RBC UA: NEGATIVE
UROBILINOGEN UA: NEGATIVE

## 2015-12-27 LAB — CBC
HCT: 41 % (ref 36.0–46.0)
HEMOGLOBIN: 13.6 g/dL (ref 12.0–15.0)
MCH: 30.8 pg (ref 26.0–34.0)
MCHC: 33.2 g/dL (ref 30.0–36.0)
MCV: 93 fL (ref 78.0–100.0)
MPV: 9.7 fL (ref 8.6–12.4)
PLATELETS: 261 10*3/uL (ref 150–400)
RBC: 4.41 MIL/uL (ref 3.87–5.11)
RDW: 12.7 % (ref 11.5–15.5)
WBC: 7.6 10*3/uL (ref 4.0–10.5)

## 2015-12-27 LAB — LIPID PANEL
CHOL/HDL RATIO: 3.7 ratio (ref <=5.0)
CHOLESTEROL: 210 mg/dL — AB (ref 125–200)
HDL: 57 mg/dL (ref >=46)
LDL Cholesterol: 130 mg/dL — ABNORMAL HIGH (ref <130)
Triglycerides: 117 mg/dL (ref <150)
VLDL: 23 mg/dL (ref <30)

## 2015-12-27 LAB — TSH: TSH: 0.65 mIU/L

## 2015-12-27 MED ORDER — VALACYCLOVIR HCL 1 G PO TABS: ORAL_TABLET | ORAL | Status: DC

## 2015-12-27 MED ORDER — SERTRALINE HCL 25 MG PO TABS: 25.0000 mg | ORAL_TABLET | Freq: Every day | ORAL | Status: DC

## 2015-12-27 NOTE — Progress Notes (Signed)
50 y.o. [id number] Married caucasian female here for annual exam.    Not happy with her weight.  Exercises regularly.  Watching her diet.   LMP 05/20/14.  Had colonoscopy.  Having constipation problems.  Thinks she is eating too many carbs. Used a laxative once.  Using Clonazepam more often.  Using it twice a week during holidays.  Last time was 2 weeks ago.  Using [employer].  Has difficulty going to sleep and staying asleep.  Having hot flashes at night and this is partially. waking patient up.  FH of anxiety and depression in her father. Patient denies hx of depression.  Daughter going to college.  [employer].  PCP:   None.   No LMP recorded. Patient is not currently having periods (Reason: Perimenopausal).          Sexually active: Yes.    The current method of family planning is Perimenopausal.    Exercising: Yes.    cardio, weights Smoker:  no  Health Maintenance: Pap:  10/08/12 wnl: neg HR HPV Neg History of abnormal Pap:  Yes  At age 73 had cryotherapy to cervix--paps normal since. MMG:  01/07/15 BiRads Category 1 Negative Colonoscopy:  03/22/15 - polyps.  Due in 10 years.  BMD:   Never   TDaP:  12/23/14 Screening Labs:  Hb today: 13.1, Urine today: Neg   reports that she has never smoked. She has never used smokeless tobacco. She reports that she drinks about 4.8 - 6.0 oz of alcohol per week. She reports that she does not use illicit drugs.  Past Medical History  Diagnosis Date  . Primary HSV infection of mouth   . Anxiety   . Abnormal Pap smear     in her 15s    Past Surgical History  Procedure Laterality Date  . Pelvic laparoscopy  2001    LSO  . Tubal ligation  2002  . Cesarean section  2002  . Gynecologic cryosurgery    . Ectopic pregnancy surgery      Current Outpatient Prescriptions  Medication Sig Dispense Refill  . clonazePAM (KLONOPIN) 0.5 MG tablet take 1/2 tablet by mouth UP TO TWICE DAILY 15 tablet 0  . valACYclovir (VALTREX) 1000 MG  tablet Take 2 tablets (2000 mg) by mouth every 12 hours for 24 hours. 30 tablet 1  . zaleplon (SONATA) 10 MG capsule take 1 capsule by mouth at bedtime 30 capsule 0   No current facility-administered medications for this visit.    Family History  Problem Relation Age of Onset  . Multiple myeloma Maternal Grandmother   . Heart attack Maternal Grandfather     ROS:  Pertinent items are noted in HPI.  Otherwise, a comprehensive ROS was negative.  Exam:   BP 132/72 mmHg  Pulse 80  Resp 16  Ht '5\' 5"'  (1.651 m)  Wt 185 lb (83.915 kg)  BMI 30.79 kg/m2    General appearance: alert, cooperative and appears stated age Head: Normocephalic, without obvious abnormality, atraumatic Neck: no adenopathy, supple, symmetrical, trachea midline and thyroid normal to inspection and palpation Lungs: clear to auscultation bilaterally Breasts: normal appearance, no masses or tenderness, Inspection negative, No nipple retraction or dimpling, No nipple discharge or bleeding, No axillary or supraclavicular adenopathy Heart: regular rate and rhythm Abdomen: soft, non-tender; bowel sounds normal; no masses,  no organomegaly Extremities: extremities normal, atraumatic, no cyanosis or edema Skin: Skin color, texture, turgor normal. No rashes or lesions Lymph nodes: Cervical, supraclavicular, and axillary nodes  normal. No abnormal inguinal nodes palpated Neurologic: Grossly normal  Pelvic: External genitalia:  no lesions              Urethra:  normal appearing urethra with no masses, tenderness or lesions              Bartholins and Skenes: normal                 Vagina: normal appearing vagina with normal color and discharge, no lesions              Cervix: no lesions              Pap taken: Yes.   Bimanual Exam:  Uterus:  normal size, contour, position, consistency, mobility, non-tender              Adnexa: normal adnexa and no mass, fullness, tenderness              Rectovaginal: Yes.  .  Confirms.               Anus:  normal sphincter tone, no lesions  Chaperone was present for exam.  Assessment:   Well woman visit with normal exam. Remote history of cryotherapy. Weight gain.  Constipation. Anxiety.  Increase use of antianxiety medication.  Menopausal symptoms.  Hx of GDM.  Plan: Yearly mammogram recommended after age 65.  Recommended self breast exam.  Pap and HR HPV as above. Discussed Calcium, Vitamin D, regular exercise program including cardiovascular and weight bearing exercise. Labs performed.  Yes.  See  orders. Refills given on medications.  No..   I discussed increased anxiety and menopausal symptoms and we discussed options for care.  Will start Zoloft 25 mg daily.  Discussed side effects - weight gain, decreased libido, Serotonin syndrome. Discussed counting liquid calories as well as solid foods. I also recommended the weight management program through [facility].  Patient will pursue more information about this on her own. Follow up in 6 weeks for a recheck appointment. Follow up annually and prn.   Additional counseling given regarding constipation, weight management, anxiety and reduction of symptoms through SSRIs and SNRIs which can also treat menopausal symptoms.  Will not refill Clonazepam and Sonata at this time.  She will reduce her consumption of diuretic beverages. 15 minutes of additional face to face time of which over 50% was spent in counseling.   After visit summary provided.

## 2015-12-27 NOTE — Patient Instructions (Signed)

## 2015-12-28 ENCOUNTER — Encounter: Payer: Self-pay | Admitting: Obstetrics and Gynecology

## 2015-12-29 LAB — IPS PAP TEST WITH HPV

## 2015-12-30 ENCOUNTER — Telehealth: Payer: Self-pay

## 2015-12-30 NOTE — Telephone Encounter (Signed)
Left message to call Charliene Inoue at [phone]. 

## 2015-12-30 NOTE — Telephone Encounter (Signed)
Spoke with patient. Results given as seen below. Patient is agreeable and verbalizes understanding. 02 recall placed.  Routing to provider for final review. Patient agreeable to disposition. Will close encounter.

## 2015-12-30 NOTE — Telephone Encounter (Signed)
-----   Message from Patton Salles, MD sent at 12/28/2015  7:16 PM EST ----- Please contact patient with results.  Labs show elevate hemoglobin A1C into a prediabetic range.  I recommend a low sugar/low carb diet.  We discussed an organized weight loss plan at her office visit yesterday.  I think this may be very helpful for her.   Her cholesterol panel showed total cholesterol 210 and LDL cholesterol at 130.   The ratios of the good and bad cholesterol looked good.   The thyroid, remaining blood chemistries, and blood counts were normal.   Thanks.  Cc- Claudette Laws

## 2016-02-07 ENCOUNTER — Ambulatory Visit (INDEPENDENT_AMBULATORY_CARE_PROVIDER_SITE_OTHER): Payer: 59 | Admitting: Obstetrics and Gynecology

## 2016-02-07 ENCOUNTER — Encounter: Payer: Self-pay | Admitting: Obstetrics and Gynecology

## 2016-02-07 VITALS — BP 126/68 | HR 84 | Ht 65.0 in | Wt 184.6 lb

## 2016-02-07 DIAGNOSIS — F411 Generalized anxiety disorder: Secondary | ICD-10-CM

## 2016-02-07 MED ORDER — SERTRALINE HCL 25 MG PO TABS: 25.0000 mg | ORAL_TABLET | Freq: Every day | ORAL | Status: DC

## 2016-02-07 NOTE — Progress Notes (Signed)
Patient ID: Abigail Sawyer, female   DOB: [birth year], 50 y.o.   MRN: [id number] GYNECOLOGY  VISIT   HPI: 50 y.o.   Married  Caucasian  female   [phone] with Patient's last menstrual period was 05/20/2014.   here for 6 week follow up.    Started Zoloft for anxiety and menopausal symptoms.  Slleeping well now.  Not using Klonipin.  Less drinking of wine since on Zoloft.  Taking the Zoloft before bed.  This helps her to sleep. Feels less on edge. Feels really appreciative for the recommendation for treatment of the anxiety with the Zoloft.  Exercising well. Loosing weight also.   Has a good bible support group as well.  GYNECOLOGIC HISTORY: Patient's last menstrual period was 05/20/2014. Contraception:Perimenopausal Menopausal hormone therapy: Tubal Last mammogram: 01/07/15 BiRads Category 1 Negative Last pap smear: 12-27-15  Neg:Neg HR HPV        OB History    Gravida Para Term Preterm AB TAB SAB Ectopic Multiple Living   '4 2 2  2  1 1  2         ' There are no active problems to display for this patient.   Past Medical History  Diagnosis Date  . Primary HSV infection of mouth   . Anxiety   . Abnormal Pap smear     in her 40s  . Elevated hemoglobin A1c 12/27/15    5.7    Past Surgical History  Procedure Laterality Date  . Pelvic laparoscopy  2001    LSO  . Tubal ligation  2002  . Cesarean section  2002  . Gynecologic cryosurgery    . Ectopic pregnancy surgery      Current Outpatient Prescriptions  Medication Sig Dispense Refill  . clonazePAM (KLONOPIN) 0.5 MG tablet take 1/2 tablet by mouth UP TO TWICE DAILY 15 tablet 0  . sertraline (ZOLOFT) 25 MG tablet Take 1 tablet (25 mg total) by mouth daily. 30 tablet 5  . valACYclovir (VALTREX) 1000 MG tablet Take 2 tablets (2000 mg) by mouth every 12 hours for 24 hours. 30 tablet 1  . zaleplon (SONATA) 10 MG capsule take 1 capsule by mouth at bedtime 30 capsule 0   No current facility-administered medications for this  visit.     ALLERGIES: Review of patient's allergies indicates no known allergies.  Family History  Problem Relation Age of Onset  . Multiple myeloma Maternal Grandmother   . Heart attack Maternal Grandfather     Social History   Social History  . Marital Status: Married    Spouse Name: N/A  . Number of Children: N/A  . Years of Education: N/A   Occupational History  . Not on file.   Social History Main Topics  . Smoking status: Never Smoker   . Smokeless tobacco: Never Used  . Alcohol Use: 4.8 - 6.0 oz/week    4 Standard drinks or equivalent, 4-6 Glasses of wine per week  . Drug Use: No  . Sexual Activity:    Partners: Male    Birth Control/ Protection: Surgical     Comment: tubal   Other Topics Concern  . Not on file   Social History Narrative    ROS:  Pertinent items are noted in HPI.  PHYSICAL EXAMINATION:    BP 126/68 mmHg  Pulse 84  Ht '5\' 5"'  (1.651 m)  Wt 184 lb 9.6 oz (83.734 kg)  BMI 30.72 kg/m2  LMP 05/20/2014    General appearance: alert, cooperative and  appears stated age  ASSESSMENT  Anxiety improved with Zoloft 25 mg daily.  Insomnia resolved.   PLAN  Counseled regarding Zoloft and potential side effects of weight gain and decreased libido.  Continue Zoloft 25 mg daily.  Declines establishing care with a counselor.  Follow up for annual exam and prn.  An After Visit Summary was printed and given to the patient.  ___15___ minutes face to face time of which over 50% was spent in counseling.

## 2016-02-25 ENCOUNTER — Other Ambulatory Visit: Payer: Self-pay

## 2016-02-25 DIAGNOSIS — Z1231 Encounter for screening mammogram for malignant neoplasm of breast: Secondary | ICD-10-CM

## 2016-03-15 ENCOUNTER — Ambulatory Visit: Payer: Self-pay

## 2016-03-22 ENCOUNTER — Ambulatory Visit
Admission: RE | Admit: 2016-03-22 | Discharge: 2016-03-22 | Disposition: A | Discharge status: Home / Self Care | Payer: 59 | Source: Ambulatory Visit

## 2016-03-22 DIAGNOSIS — Z1231 Encounter for screening mammogram for malignant neoplasm of breast: Secondary | ICD-10-CM

## 2017-01-10 NOTE — Progress Notes (Signed)
51 y.o. [id number] Married Caucasian female here for annual exam.    Taking Zoloft 25 mg and doing well.  Thankful for the prescription. Wants to continue.  Not taking anxiety or sleep medication now.  Valtrex helpful for HSV I fever blisters.  No bleeding or spotting.   Wants to see dermatology for an an area of dry skin on her back and also for a general skin check.  Gained 10 pounds.  Has carpal tunnel in right hand.  Uses a brace.   PCP:   None   Patient's last menstrual period was 05/20/2014.           Sexually active: Yes.   female The current method of family planning is post menopausal status.    Exercising: Yes.    Cardio, weights and tennis Smoker:  no  Health Maintenance: Pap:  12-27-15 Neg:Neg HR HPV; 10-08-12 Neg:Neg HR HPV History of abnormal Pap:  Yes, Hx cryotherapy at age 89--paps normal since. MMG:  04-22-16 Density B/Neg/BiRads1:TBC Colonoscopy:  03-22-15 polyps with [facility];next due 03/2025 BMD:   n/a  Result  n/a TDaP:  12-23-14 Gardasil:   N/A  HIV:  Neg in pregnancy. Hep C:  Today.  Screening Labs:   Urine today: not done   reports that she has never smoked. She has never used smokeless tobacco. She reports that she drinks about 3.6 - 4.8 oz of alcohol per week . She reports that she does not use drugs.  Past Medical History:  Diagnosis Date  . Abnormal Pap smear    in her 77s  . Anxiety   . Elevated hemoglobin A1c 12/27/15   5.7  . Primary HSV infection of mouth     Past Surgical History:  Procedure Laterality Date  . CESAREAN SECTION  2002  . ECTOPIC PREGNANCY SURGERY    . GYNECOLOGIC CRYOSURGERY    . PELVIC LAPAROSCOPY  2001   LSO  . TUBAL LIGATION  2002    Current Outpatient Prescriptions  Medication Sig Dispense Refill  . sertraline (ZOLOFT) 25 MG tablet Take 1 tablet (25 mg total) by mouth daily. 90 tablet 3  . valACYclovir (VALTREX) 1000 MG tablet Take 2 tablets (2000 mg) by mouth every 12 hours for 24 hours. 30 tablet 1   No current  facility-administered medications for this visit.     Family History  Problem Relation Age of Onset  . Multiple myeloma Maternal Grandmother   . Heart attack Maternal Grandfather   . Hyperlipidemia Mother     ROS:  Pertinent items are noted in HPI.  Otherwise, a comprehensive ROS was negative.  Exam:   BP 122/76 (BP Location: Right Arm, Patient Position: Sitting, Cuff Size: Normal)   Pulse 70   Resp 14   Ht '5\' 5"'  (1.651 m)   Wt 182 lb 12.8 oz (82.9 kg)   LMP 05/20/2014   BMI 30.42 kg/m     General appearance: alert, cooperative and appears stated age Head: Normocephalic, without obvious abnormality, atraumatic Neck: no adenopathy, supple, symmetrical, trachea midline and thyroid normal to inspection and palpation Lungs: clear to auscultation bilaterally Breasts: normal appearance, no masses or tenderness, No nipple retraction or dimpling, No nipple discharge or bleeding, No axillary or supraclavicular adenopathy Heart: regular rate and rhythm Abdomen: soft, non-tender; no masses, no organomegaly Extremities: extremities normal, atraumatic, no cyanosis or edema Skin: Skin color, texture, turgor normal. No rashes.  Sebaceous cyst on mid back - 5 - 6 mm.  Lymph nodes: Cervical, supraclavicular,  and axillary nodes normal. No abnormal inguinal nodes palpated Neurologic: Grossly normal  Pelvic: External genitalia:  no lesions              Urethra:  normal appearing urethra with no masses, tenderness or lesions              Bartholins and Skenes: normal                 Vagina: normal appearing vagina with normal color and discharge, no lesions              Cervix: no lesions              Pap taken: No. Bimanual Exam:  Uterus:  normal size, contour, position, consistency, mobility, non-tender              Adnexa: no mass, fullness, tenderness              Rectal exam: Yes.  .  Confirms.              Anus:  normal sphincter tone, no lesions  Chaperone was present for  exam.  Assessment:   Well woman visit with normal exam. Remote history of cryotherapy. Anxiety.  On Zoloft.  Hx of GDM. HSV I. Sebaceous cyst of skin on back. Weight gain.   Plan: Mammogram screening discussed. Recommended self breast awareness. Pap and HR HPV as above. Guidelines for Calcium, Vitamin D, regular exercise program including cardiovascular and weight bearing exercise. Zoloft 25 mg daily.  #90, RF 3.  Valtrex 1000 mg, 2 po bid q 12 hours x 24 hours prn.  #30, RF one.  Routine labs, HgbA1C, hep C aby. Referral to dermatology.  Follow up annually and prn.       After visit summary provided.

## 2017-01-11 ENCOUNTER — Ambulatory Visit (INDEPENDENT_AMBULATORY_CARE_PROVIDER_SITE_OTHER): Payer: 59 | Admitting: Obstetrics and Gynecology

## 2017-01-11 ENCOUNTER — Encounter: Payer: Self-pay | Admitting: Obstetrics and Gynecology

## 2017-01-11 VITALS — BP 122/76 | HR 70 | Resp 14 | Ht 65.0 in | Wt 182.8 lb

## 2017-01-11 DIAGNOSIS — Z119 Encounter for screening for infectious and parasitic diseases, unspecified: Secondary | ICD-10-CM

## 2017-01-11 DIAGNOSIS — L989 Disorder of the skin and subcutaneous tissue, unspecified: Secondary | ICD-10-CM

## 2017-01-11 DIAGNOSIS — Z01419 Encounter for gynecological examination (general) (routine) without abnormal findings: Secondary | ICD-10-CM

## 2017-01-11 LAB — COMPREHENSIVE METABOLIC PANEL
ALT: 24 U/L (ref 6–29)
AST: 30 U/L (ref 10–35)
Albumin: 4.7 g/dL (ref 3.6–5.1)
Alkaline Phosphatase: 56 U/L (ref 33–130)
BUN: 14 mg/dL (ref 7–25)
CHLORIDE: 103 mmol/L (ref 98–110)
CO2: 25 mmol/L (ref 20–31)
Calcium: 9.2 mg/dL (ref 8.6–10.4)
Creat: 0.85 mg/dL (ref 0.50–1.05)
GLUCOSE: 79 mg/dL (ref 65–99)
POTASSIUM: 4.3 mmol/L (ref 3.5–5.3)
Sodium: 139 mmol/L (ref 135–146)
TOTAL PROTEIN: 7.8 g/dL (ref 6.1–8.1)
Total Bilirubin: 0.5 mg/dL (ref 0.2–1.2)

## 2017-01-11 LAB — CBC
HCT: 41.5 % (ref 35.0–45.0)
Hemoglobin: 13.7 g/dL (ref 11.7–15.5)
MCH: 30.6 pg (ref 27.0–33.0)
MCHC: 33 g/dL (ref 32.0–36.0)
MCV: 92.6 fL (ref 80.0–100.0)
MPV: 9.9 fL (ref 7.5–12.5)
PLATELETS: 248 10*3/uL (ref 140–400)
RBC: 4.48 MIL/uL (ref 3.80–5.10)
RDW: 12.8 % (ref 11.0–15.0)
WBC: 5 10*3/uL (ref 3.8–10.8)

## 2017-01-11 LAB — LIPID PANEL
CHOLESTEROL: 223 mg/dL — AB (ref <200)
HDL: 74 mg/dL (ref >50)
LDL Cholesterol: 134 mg/dL — ABNORMAL HIGH (ref <100)
TRIGLYCERIDES: 74 mg/dL (ref <150)
Total CHOL/HDL Ratio: 3 Ratio (ref <5.0)
VLDL: 15 mg/dL (ref <30)

## 2017-01-11 LAB — TSH: TSH: 0.76 mIU/L

## 2017-01-11 MED ORDER — VALACYCLOVIR HCL 1 G PO TABS: ORAL_TABLET | ORAL | 1 refills | Status: DC

## 2017-01-11 MED ORDER — SERTRALINE HCL 25 MG PO TABS: 25.0000 mg | ORAL_TABLET | Freq: Every day | ORAL | 3 refills | Status: DC

## 2017-01-11 NOTE — Patient Instructions (Signed)

## 2017-01-12 LAB — VITAMIN D 25 HYDROXY (VIT D DEFICIENCY, FRACTURES): VIT D 25 HYDROXY: 36 ng/mL (ref 30–100)

## 2017-01-12 LAB — HEMOGLOBIN A1C
Hgb A1c MFr Bld: 5.2 % (ref <5.7)
MEAN PLASMA GLUCOSE: 103 mg/dL

## 2017-01-12 LAB — HEPATITIS C ANTIBODY: HCV Ab: NEGATIVE

## 2017-12-31 ENCOUNTER — Other Ambulatory Visit: Payer: Self-pay | Admitting: Obstetrics and Gynecology

## 2017-12-31 DIAGNOSIS — Z1231 Encounter for screening mammogram for malignant neoplasm of breast: Secondary | ICD-10-CM

## 2018-01-03 ENCOUNTER — Ambulatory Visit
Admission: RE | Admit: 2018-01-03 | Discharge: 2018-01-03 | Disposition: A | Discharge status: Home / Self Care | Payer: 59 | Source: Ambulatory Visit | Attending: Obstetrics and Gynecology | Admitting: Obstetrics and Gynecology

## 2018-01-03 DIAGNOSIS — Z1231 Encounter for screening mammogram for malignant neoplasm of breast: Secondary | ICD-10-CM

## 2018-01-16 ENCOUNTER — Encounter: Payer: Self-pay | Admitting: Obstetrics and Gynecology

## 2018-01-16 ENCOUNTER — Other Ambulatory Visit: Payer: Self-pay

## 2018-01-16 ENCOUNTER — Ambulatory Visit: Payer: 59 | Admitting: Obstetrics and Gynecology

## 2018-01-16 VITALS — BP 112/70 | HR 68 | Resp 16 | Ht 64.75 in | Wt 180.0 lb

## 2018-01-16 DIAGNOSIS — Z01419 Encounter for gynecological examination (general) (routine) without abnormal findings: Secondary | ICD-10-CM | POA: Diagnosis not present

## 2018-01-16 MED ORDER — CLONAZEPAM 0.5 MG PO TABS: 0.5000 mg | ORAL_TABLET | Freq: Two times a day (BID) | ORAL | 1 refills | Status: DC | PRN

## 2018-01-16 MED ORDER — VALACYCLOVIR HCL 1 G PO TABS: ORAL_TABLET | ORAL | 1 refills | Status: DC

## 2018-01-16 NOTE — Patient Instructions (Signed)

## 2018-01-16 NOTE — Progress Notes (Signed)
52 y.o. [id number] Married Caucasian female here for annual exam.    New goal for weight control. Plan to loose 20 pounds. Doing a low carb diet.  Constipation.  Drinks several cups of coffee per day.  Off Zoloft for 6 months and doing well. Feels like she is now through her "menopause fog." No hot flashes.  Felt intensely dehydrated on the Zoloft.   Asking for something to take while she is flying.  Took Clonazepam in the past.  Feels anxious about flying.   Also not sleeping well and wants something for sleep. She cannot stop thinking about things.  PCP:   No PCP  Patient's last menstrual period was 05/20/2014.           Sexually active: Yes.    The current method of family planning is post menopausal status.    Exercising: Yes.    tennis and bike Smoker:  no  Health Maintenance: Pap:   12-27-15 Neg:Neg HR HPV History of abnormal Pap:  Yes, Hx cryotherapy at age 58--paps normal since MMG:  01/03/18 BIRADS 1 negative/density b Colonoscopy:   03-22-15 polyps with [facility];next due 03/2025 BMD:   n/a  Result  n/a TDaP:  12/23/14 Gardasil:   n/a HIV: 01/11/17 Negative Hep C: never Screening Labs:  Discuss today   reports that  has never smoked. she has never used smokeless tobacco. She reports that she drinks about 3.6 - 4.8 oz of alcohol per week. She reports that she does not use drugs.  Past Medical History:  Diagnosis Date  . Abnormal Pap smear    in her 53s  . Anxiety   . Elevated hemoglobin A1c 12/27/15   5.7  . Primary HSV infection of mouth     Past Surgical History:  Procedure Laterality Date  . CESAREAN SECTION  2002  . ECTOPIC PREGNANCY SURGERY    . GYNECOLOGIC CRYOSURGERY    . PELVIC LAPAROSCOPY  2001   LSO  . TUBAL LIGATION  2002    Current Outpatient Medications  Medication Sig Dispense Refill  . valACYclovir (VALTREX) 1000 MG tablet Take 2 tablets (2000 mg) by mouth every 12 hours for 24 hours. 30 tablet 1   No current facility-administered  medications for this visit.     Family History  Problem Relation Age of Onset  . Multiple myeloma Maternal Grandmother   . Heart attack Maternal Grandfather   . Hyperlipidemia Mother     ROS:  Pertinent items are noted in HPI.  Otherwise, a comprehensive ROS was negative.  Exam:   BP 112/70 (BP Location: Right Arm, Patient Position: Sitting, Cuff Size: Normal)   Pulse 68   Resp 16   Ht 5' 4.75" (1.645 m)   Wt 180 lb (81.6 kg)   LMP 05/20/2014   BMI 30.19 kg/m     General appearance: alert, cooperative and appears stated age Head: Normocephalic, without obvious abnormality, atraumatic Neck: no adenopathy, supple, symmetrical, trachea midline and thyroid normal to inspection and palpation Lungs: clear to auscultation bilaterally Breasts: normal appearance, no masses or tenderness, No nipple retraction or dimpling, No nipple discharge or bleeding, No axillary or supraclavicular adenopathy Heart: regular rate and rhythm Abdomen: soft, non-tender; no masses, no organomegaly Extremities: extremities normal, atraumatic, no cyanosis or edema Skin: Skin color, texture, turgor normal. No rashes or lesions Lymph nodes: Cervical, supraclavicular, and axillary nodes normal. No abnormal inguinal nodes palpated Neurologic: Grossly normal  Pelvic: External genitalia:  no lesions  Urethra:  normal appearing urethra with no masses, tenderness or lesions              Bartholins and Skenes: normal                 Vagina: normal appearing vagina with normal color and discharge, no lesions              Cervix: no lesions              Pap taken: No. Bimanual Exam:  Uterus:  normal size, contour, position, consistency, mobility, non-tender              Adnexa: no mass, fullness, tenderness              Rectal exam: Yes.  .  Confirms.              Anus:  normal sphincter tone, no lesions  Chaperone was present for exam.  Assessment:   Well woman visit with normal exam. Remote  history of cryotherapy. Anxiety with flying.  Off Zoloft.  Insomnia.  Hx of GDM. HSV I. Desire for weight loss.  Plan: Mammogram screening discussed. Recommended self breast awareness. Pap and HR HPV as above. Guidelines for Calcium, Vitamin D, regular exercise program including cardiovascular and weight bearing exercise. Rx for Clonazepam 0.5 mg po bid prn.  #30, RF two.  Reduce caffeine use.  Colace prn for stool softening.  Routine labs including HgbA1C. List of PCPs to patient.  Follow up annually and prn.    After visit summary provided.

## 2018-01-17 ENCOUNTER — Encounter: Payer: Self-pay | Admitting: Obstetrics and Gynecology

## 2018-01-17 LAB — LIPID PANEL
CHOLESTEROL TOTAL: 252 mg/dL — AB (ref 100–199)
Chol/HDL Ratio: 3.4 ratio (ref 0.0–4.4)
HDL: 75 mg/dL (ref >39)
LDL Calculated: 163 mg/dL — ABNORMAL HIGH (ref 0–99)
TRIGLYCERIDES: 70 mg/dL (ref 0–149)
VLDL CHOLESTEROL CAL: 14 mg/dL (ref 5–40)

## 2018-01-17 LAB — COMPREHENSIVE METABOLIC PANEL
A/G RATIO: 1.6 (ref 1.2–2.2)
ALK PHOS: 59 IU/L (ref 39–117)
ALT: 28 IU/L (ref 0–32)
AST: 28 IU/L (ref 0–40)
Albumin: 4.9 g/dL (ref 3.5–5.5)
BILIRUBIN TOTAL: 0.4 mg/dL (ref 0.0–1.2)
BUN/Creatinine Ratio: 18 (ref 9–23)
BUN: 13 mg/dL (ref 6–24)
CHLORIDE: 100 mmol/L (ref 96–106)
CO2: 22 mmol/L (ref 20–29)
Calcium: 9.7 mg/dL (ref 8.7–10.2)
Creatinine, Ser: 0.73 mg/dL (ref 0.57–1.00)
GFR calc Af Amer: 108 mL/min/{1.73_m2} (ref >59)
GFR calc non Af Amer: 94 mL/min/{1.73_m2} (ref >59)
Globulin, Total: 3.1 g/dL (ref 1.5–4.5)
Glucose: 85 mg/dL (ref 65–99)
POTASSIUM: 4.8 mmol/L (ref 3.5–5.2)
SODIUM: 139 mmol/L (ref 134–144)
Total Protein: 8 g/dL (ref 6.0–8.5)

## 2018-01-17 LAB — CBC
Hematocrit: 41.8 % (ref 34.0–46.6)
Hemoglobin: 14.2 g/dL (ref 11.1–15.9)
MCH: 31 pg (ref 26.6–33.0)
MCHC: 34 g/dL (ref 31.5–35.7)
MCV: 91 fL (ref 79–97)
PLATELETS: 239 10*3/uL (ref 150–379)
RBC: 4.58 x10E6/uL (ref 3.77–5.28)
RDW: 13.1 % (ref 12.3–15.4)
WBC: 4.2 10*3/uL (ref 3.4–10.8)

## 2018-01-17 LAB — TSH: TSH: 0.89 u[IU]/mL (ref 0.450–4.500)

## 2018-01-17 LAB — HEMOGLOBIN A1C
Est. average glucose Bld gHb Est-mCnc: 105 mg/dL
Hgb A1c MFr Bld: 5.3 % (ref 4.8–5.6)

## 2018-01-17 LAB — VITAMIN D 25 HYDROXY (VIT D DEFICIENCY, FRACTURES): Vit D, 25-Hydroxy: 40.9 ng/mL (ref 30.0–100.0)

## 2018-04-03 ENCOUNTER — Telehealth: Payer: Self-pay | Admitting: Obstetrics and Gynecology

## 2018-04-03 NOTE — Telephone Encounter (Signed)
Patient stated that she had an aex with Dr. Edward Jolly in February and did not think that she needed a refill for Zoloft. She now would like a refill called in.

## 2018-04-03 NOTE — Telephone Encounter (Signed)
Spoke with patient and she states that she had some Zoloft left and she started back on it about 3 weeks ago and she feels she is tolerating it well. It seems to be helping. She is worried now about stopping it abruptly. She wishes to continue on the Zoloft.  Please advise

## 2018-04-03 NOTE — Telephone Encounter (Signed)
Medication refill request: Zoloft Last AEX:  01-16-18  Next AEX: 01-20-19 Last MMG (if hormonal medication request): 01-04-18 WNL  Refill authorized: please advise

## 2018-04-03 NOTE — Telephone Encounter (Signed)
Patient states she was having problems with the Zoloft.  She reported intense dehydration.  Do we need to reassess her treatment options?

## 2018-04-03 NOTE — Telephone Encounter (Signed)
OK to continue Zoloft 25 mg daily.  Dispense:  90, RF 2.

## 2018-04-04 MED ORDER — SERTRALINE HCL 25 MG PO TABS: 25.0000 mg | ORAL_TABLET | Freq: Every day | ORAL | 2 refills | Status: DC

## 2018-04-04 NOTE — Telephone Encounter (Signed)
Left message letting patient know RX was sent in -eh 

## 2018-04-04 NOTE — Telephone Encounter (Signed)
RX for Zoloft sent into pharmacy -eh

## 2018-04-05 ENCOUNTER — Encounter: Payer: Self-pay | Admitting: Family Medicine

## 2018-04-05 ENCOUNTER — Ambulatory Visit (INDEPENDENT_AMBULATORY_CARE_PROVIDER_SITE_OTHER): Payer: 59 | Admitting: Family Medicine

## 2018-04-05 VITALS — BP 128/76 | Ht 65.0 in | Wt 175.0 lb

## 2018-04-05 DIAGNOSIS — M25512 Pain in left shoulder: Secondary | ICD-10-CM

## 2018-04-05 MED ORDER — MELOXICAM 15 MG PO TABS: ORAL_TABLET | ORAL | 1 refills | Status: DC

## 2018-04-05 NOTE — Progress Notes (Signed)
Chief complaint: Left shoulder pain x2 weeks  History of present illness: Abigail Sawyer is a 52 year old right-hand-dominant female who presents to the sports medicine office today with chief complaint of left shoulder pain.  She reports that symptoms have been present for approximately 2 weeks now.  She points specifically to the anterior aspect of her left shoulder as to where she feels the pain.  She reports that 2 weeks ago she was doing some yard work, specifically pruning some bushes and trees.  She reports that shortly after finishing this yard work she started feeling a vague, aching pain in her left anterior shoulder.  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  She reports that when she was playing tennis she noticed this pain. It actually has limited her from playing tennis over the last week.  She reports that she uses her left arm to throw the ball up to be able to serve it.  She has not been able to throw the ball up to be able to serve secondary to pain limiting her from doing this.  She reports that symptoms have been about the same over the last 2 weeks.  She does not report of any previous shoulder injury or trauma.  She does not report of any history of left shoulder dislocation or subluxation.  She reports that she has been using occasional Advil, Biofreeze, and doing some stretching and some yoga.  She reports that this has minimally helped her.  She does report of pain when trying to sleep on her left side at nighttime.  Fortunately, she does not report of any pain wake her up from sleep at nighttime.  She does not report of any neck pain, posterior shoulder pain, or pain radiating from her neck down her shoulder, down to her arm, hand, and fingers on the left side.  She does not report of any weakness in her left arm or with grip strength.  She reports pain with reaching back to grab her seatbelt or her bra strap.  She occasionally feels a popping pain in her left shoulder,  but no other locking or mechanical symptoms in her shoulder.  She does not report of any left elbow pain.  She does not report of any radiation of pain in her left shoulder.  She reports a few days ago she did have pain in the lateral aspect of her left upper arm radiating down to close to her left elbow, but this has subsided.  She does not report of any fevers, chills, night sweats, or any unintentional weight loss.  Review of systems:  As stated above  Her past medical history, surgical history, family history, and social history obtained and reviewed.  Her past medical history is notable for anxiety; surgical history notable for pelvic laparoscopy for ectopic pregnancy, C-section, and tubal ligation; she does not report of any current tobacco use; family history notable for MI, hyperlipidemia, and multiple myeloma; allergies and medications are reviewed and are reflected in EMR.  Physical exam: Vital signs are reviewed and are documented in the chart Gen.: Alert, oriented, appears stated age, in no apparent distress HEENT: Moist oral mucosa Respiratory: Normal respirations, able to speak in full sentences Cardiac: Regular rate, distal pulses 2+ Integumentary: No rashes on visible skin:  Neurologic: Rotator cuff strength on the left side is intact, would categorize rotator cuff strength as 5/5, sensation 2+ in bilateral upper extremities Psych: Normal affect, mood is described as good  Musculoskeletal: Inspection of her left shoulder reveals no obvious deformity or muscle atrophy, no warmth, erythema, ecchymosis, or effusion, she is tender to palpation in the distal supraspinatus and subscapularis tendon and its insertion over the humeral head, no tenderness over the [facility] joint, distal clavicle, acromion, scapular spine, or anywhere in the posterior shoulder, no tenderness of the deltoid, triceps, or biceps, she does have limitations in shoulder range of motion today secondary to pain, with forward  flexion she is only able to go to about 110 degrees before pain limits her from going further, similar with abduction, she is only able to go to about 100 degrees before pain limits her from going further, with shoulder internal range of motion she is only able to go to about the L4 level, in comparison with the right side going to T10, with arms adducted she does have full shoulder internal and external range of motion, but with extremes of external range of motion she feels a slight twinge of pain in the anterior shoulder, does report of pain with going through internal range of motion, rotator cuff impingement testing is positive as empty can, Hawkins, Neers, crossarm, and O'Brien are all positive, Speed,  Yergason, and Spurling negative  Limited musculoskeletal ultrasound was performed in the office today of her left shoulder: -Slight circumferential hypoechoic changes seen of the biceps tendon on the long axis view, but no evidence biceps tendon tear or intra-fiber involvement -Do see slight articular sided cortical irregularity of the humeral head when taking a look at the subscapularis tendon, do see slight articular sided hypoechoic changes, with slight neovascularization seen on color flow Doppler, on dynamic view do not see any evidence of subscapularis impingement over the coracoid -Unremarkable appearing supraspinatus without any evidence of any tearing  Impression: Left subscapularis tendinitis  Ultrasound performed and interpreted by: Mort Sawyers, MD  Assessment and plan: 1.  Left anterior shoulder pain x2 weeks, suspect secondary to left rotator cuff tendinitis, specifically of the subscapularis tendon  Plan: Discussed with Beth today that she probably overdid things with pruning and cutting the bushes a few weeks ago.  She describes having repetitive internal and external range of motion as well as abduction when doing the pruning.  She did discuss having a lot of force being  applied when doing this.  This in addition to her tennis activities probably aggravated things.  I do not see any evidence of full-thickness rotator cuff tear, as she does have full rotator cuff strength.  She does have a little bit of hypoechoic changes seen on ultrasound, no evidence of any major subscapularis or supraspinatus involvement otherwise.  Discussed having her started on some [place] exercises working on rotator cuff strengthening.  Will have her started on meloxicam 15 mg daily for the next 7 to 10 days, then take daily as needed thereafterwards.  Discussed not to use any Motrin, ibuprofen, or Aleve while on this medication.  Discussed combination of heat and ice.  Discussed plan to have her follow-up in about 4 weeks to see how this does.  If she is not any better, will plan for repeat ultrasound for further evaluation.  Discussed over the next few weeks to avoid tennis activities, but after couple weeks can do light tosses and gentle swings of the racquet to see how she responds to this.   Mort Sawyers, M.D. [facility]

## 2018-06-12 ENCOUNTER — Other Ambulatory Visit: Payer: Self-pay | Admitting: *Deleted

## 2018-06-12 MED ORDER — MELOXICAM 15 MG PO TABS: ORAL_TABLET | ORAL | 1 refills | Status: DC

## 2018-08-14 ENCOUNTER — Other Ambulatory Visit: Payer: Self-pay | Admitting: Family Medicine

## 2019-01-08 ENCOUNTER — Other Ambulatory Visit: Payer: Self-pay | Admitting: Obstetrics and Gynecology

## 2019-01-08 DIAGNOSIS — Z1231 Encounter for screening mammogram for malignant neoplasm of breast: Secondary | ICD-10-CM

## 2019-01-20 ENCOUNTER — Ambulatory Visit: Payer: 59 | Admitting: Obstetrics and Gynecology

## 2019-01-20 ENCOUNTER — Other Ambulatory Visit (HOSPITAL_COMMUNITY)
Admission: RE | Admit: 2019-01-20 | Discharge: 2019-01-20 | Disposition: A | Discharge status: Home / Self Care | Payer: 59 | Source: Ambulatory Visit | Attending: Obstetrics and Gynecology | Admitting: Obstetrics and Gynecology

## 2019-01-20 ENCOUNTER — Encounter: Payer: Self-pay | Admitting: Obstetrics and Gynecology

## 2019-01-20 ENCOUNTER — Other Ambulatory Visit: Payer: Self-pay

## 2019-01-20 VITALS — BP 118/62 | HR 72 | Resp 14 | Ht 64.5 in | Wt 186.7 lb

## 2019-01-20 DIAGNOSIS — Z01419 Encounter for gynecological examination (general) (routine) without abnormal findings: Secondary | ICD-10-CM | POA: Diagnosis not present

## 2019-01-20 MED ORDER — ESTRADIOL 10 MCG VA TABS: 1.0000 | ORAL_TABLET | VAGINAL | 11 refills | Status: DC

## 2019-01-20 MED ORDER — VALACYCLOVIR HCL 1 G PO TABS: ORAL_TABLET | ORAL | 1 refills | Status: DC

## 2019-01-20 MED ORDER — CLONAZEPAM 0.5 MG PO TABS: 0.5000 mg | ORAL_TABLET | Freq: Two times a day (BID) | ORAL | 0 refills | Status: DC | PRN

## 2019-01-20 NOTE — Patient Instructions (Signed)

## 2019-01-20 NOTE — Progress Notes (Signed)
53 y.o. [id number] Married Caucasian female here for annual exam.    Having a chest pain on and off for several months.  It is a soreness on her right chest toward the midline.  Pain feels muscular and on top of her ribs.  She plays tennis and thinks it may be related to this.  Having pain with intercourse.  Unable to have intercourse.  Not taking Zoloft.  Situational stress. Father in hospice with Parkinsons. Uses the Clonazepam prn.  Takes it once or twice a month.  Uses it to sleep on occasion.  Will see Dr. Jeremy Johann in July, 2020.   PCP:   No pcp   Patient's last menstrual period was 05/20/2014.           Sexually active: Yes.    The current method of family planning is post menopausal status.    Exercising: Yes.    Biking and Tennis  Smoker:  no  Health Maintenance: Pap: 12-27-15 Neg:Neg HR HPV, 10-08-12 Neg:Neg HR HPV History of abnormal Pap:  Yes, Hx cryotherapy at age 15--paps normal since MMG: 01-03-18 3D Neg/density B/BiRads1--Appt. 02-05-19 Colonoscopy:03-22-15 polyps with [facility];next due 03/2025  BMD:   n/a  Result  n/a TDaP:  12-23-14 Gardasil:   no HIV: 01-11-17 NR Hep C: 01-11-17 Neg Screening Labs:would like to talk about them today   Hb today: no, Urine today: none  Flu vaccine:  Recommended.   reports that she has never smoked. She has never used smokeless tobacco. She reports current alcohol use of about 6.0 - 8.0 standard drinks of alcohol per week. She reports that she does not use drugs.  Past Medical History:  Diagnosis Date  . Abnormal Pap smear    in her 17s  . Anxiety   . Elevated hemoglobin A1c 12/27/15   5.7  . Elevated LDL cholesterol level   . Primary HSV infection of mouth     Past Surgical History:  Procedure Laterality Date  . CESAREAN SECTION  2002  . ECTOPIC PREGNANCY SURGERY    . GYNECOLOGIC CRYOSURGERY    . PELVIC LAPAROSCOPY  2001   LSO  . TUBAL LIGATION  2002    Current Outpatient Medications  Medication Sig Dispense  Refill  . clonazePAM (KLONOPIN) 0.5 MG tablet Take 1 tablet (0.5 mg total) by mouth 2 (two) times daily as needed for anxiety. 30 tablet 1  . meloxicam (MOBIC) 15 MG tablet Take 1 tablet daily for 7-10 days. Then take as needed. 30 tablet 1  . meloxicam (MOBIC) 15 MG tablet TAKE 1 TABLET BY MOUTH DAILY FOR 7 TO 10 DAYS; THEREAFTER, TAKE AS NEEDED 30 tablet 0  . valACYclovir (VALTREX) 1000 MG tablet Take 2 tablets (2000 mg) by mouth every 12 hours for 24 hours. 30 tablet 1   No current facility-administered medications for this visit.     Family History  Problem Relation Age of Onset  . Multiple myeloma Maternal Grandmother   . Heart attack Maternal Grandfather   . Hyperlipidemia Mother     Review of Systems  Cardiovascular: Positive for chest pain.  Genitourinary:       Pain or bleeding with intercourse   Musculoskeletal: Positive for joint swelling.    Exam:   BP 118/62   Pulse 72   Resp 14   Ht 5' 4.5" (1.638 m)   Wt 186 lb 11.2 oz (84.7 kg)   LMP 05/20/2014   BMI 31.55 kg/m     General appearance: alert,  cooperative and appears stated age Head: Normocephalic, without obvious abnormality, atraumatic Neck: no adenopathy, supple, symmetrical, trachea midline and thyroid normal to inspection and palpation Lungs: clear to auscultation bilaterally Breasts: normal appearance, no masses or tenderness, No nipple retraction or dimpling, No nipple discharge or bleeding, No axillary or supraclavicular adenopathy Heart: regular rate and rhythm Abdomen: soft, non-tender; no masses, no organomegaly Extremities: extremities normal, atraumatic, no cyanosis or edema Skin: Skin color, texture, turgor normal. No rashes or lesions Lymph nodes: Cervical, supraclavicular, and axillary nodes normal. No abnormal inguinal nodes palpated Neurologic: Grossly normal  Pelvic: External genitalia:  no lesions              Urethra:  normal appearing urethra with no masses, tenderness or lesions               Bartholins and Skenes: normal                 Vagina: normal appearing vagina with normal color and discharge, no lesions              Cervix: no lesions              Pap taken: Yes.   Bimanual Exam:  Uterus:  normal size, contour, position, consistency, mobility, non-tender              Adnexa: no mass, fullness, tenderness              Rectal exam: Yes.  .  Confirms.              Anus:  normal sphincter tone, no lesions  Chaperone was present for exam.  Assessment:   Well woman visit with normal exam. Remote history of cryotherapy. Anxiety with flying.Off Zoloft. Insomnia.  Hx of GDM. Hx elevated cholesterol. Right sided chest wall pain.  Likely musculoskeletal.  Costocondritis.   Plan: Mammogram screening. Recommended self breast awareness. Pap and HR HPV as above. Guidelines for Calcium, Vitamin D, regular exercise program including cardiovascular and weight bearing exercise. Refill of Valtrex.  Rx for Vagifem.  Instructed in use.  Discussed potential effect on breast cancer.  Limited Rx for Clonazepam.  I discussed tolerance and potential addictive nature of this medication.  Do not mix with any alcohol. Ibuprofen or Aleve and rest to treat chest wall pain.  She will see her new PCP this summer and have labs then.  Follow up annually and prn.   After visit summary provided.

## 2019-01-23 LAB — CYTOLOGY - PAP
Diagnosis: NEGATIVE
HPV: NOT DETECTED

## 2019-02-05 ENCOUNTER — Ambulatory Visit: Payer: 59

## 2019-03-10 ENCOUNTER — Ambulatory Visit: Payer: 59

## 2019-04-22 ENCOUNTER — Ambulatory Visit: Payer: 59

## 2019-05-26 ENCOUNTER — Telehealth: Payer: 59 | Admitting: Physician Assistant

## 2019-05-26 ENCOUNTER — Telehealth: Payer: Self-pay | Admitting: *Deleted

## 2019-05-26 ENCOUNTER — Encounter: Payer: Self-pay | Admitting: Physician Assistant

## 2019-05-26 ENCOUNTER — Telehealth: Payer: Self-pay | Admitting: Obstetrics and Gynecology

## 2019-05-26 DIAGNOSIS — R0981 Nasal congestion: Secondary | ICD-10-CM

## 2019-05-26 DIAGNOSIS — Z20822 Contact with and (suspected) exposure to covid-19: Secondary | ICD-10-CM

## 2019-05-26 DIAGNOSIS — R059 Cough, unspecified: Secondary | ICD-10-CM

## 2019-05-26 DIAGNOSIS — R05 Cough: Secondary | ICD-10-CM

## 2019-05-26 MED ORDER — BENZONATATE 100 MG PO CAPS: 100.0000 mg | ORAL_CAPSULE | Freq: Three times a day (TID) | ORAL | 0 refills | Status: DC | PRN

## 2019-05-26 NOTE — Telephone Encounter (Signed)
We will have to contact patient tomorrow to determine if we can send her for drive through testing at [facility].   Cc- Lamont Snowball

## 2019-05-26 NOTE — Progress Notes (Signed)
E-Visit for Corona Virus Screening   Your current symptoms could be consistent with the coronavirus.  Call your health care provider or local health department to request and arrange formal testing. Many health care providers can now test patients at their office but not all are.  Please quarantine yourself while awaiting your test results.  [place] [phone], [place], [place] [phone] or visit [email or web address]  and You have been enrolled in [place] for COVID-19.  Daily you will receive a questionnaire within the [place] website. Our COVID-19 response team will be monitoring your responses daily.    COVID-19 is a respiratory illness with symptoms that are similar to the flu. Symptoms are typically mild to moderate, but there have been cases of severe illness and death due to the virus. The following symptoms may appear 2-14 days after exposure: . Fever . Cough . Shortness of breath or difficulty breathing . Chills . Repeated shaking with chills . Muscle pain . Headache . Sore throat . New loss of taste or smell . Fatigue . Congestion or runny nose . Nausea or vomiting . Diarrhea  It is vitally important that if you feel that you have an infection such as this virus or any other virus that you stay home and away from places where you may spread it to others.  You should self-quarantine for 14 days if you have symptoms that could potentially be coronavirus or have been in close contact a with a person diagnosed with COVID-19 within the last 2 weeks. You should avoid contact with people age 55 and older.   You should wear a mask or cloth face covering over your nose and mouth if you must be around other people or animals, including pets (even at home). Try to stay at least 6 feet away from other people. This will protect the  people around you.  You can use medication such as A prescription cough medication called Tessalon Perles 100 mg. You may take 1-2 capsules every 8 hours as needed for cough   You may use Over the Counter Flonse to help with nasal congestion, use one spray in each nostril once daily.   You may also take acetaminophen (Tylenol) as needed for fever.   I have also provided a work note  Reduce your risk of any infection by using the same precautions used for avoiding the common cold or flu:  Marland Kitchen Wash your hands often with soap and warm water for at least 20 seconds.  If soap and water are not readily available, use an alcohol-based hand sanitizer with at least 60% alcohol.  . If coughing or sneezing, cover your mouth and nose by coughing or sneezing into the elbow areas of your shirt or coat, into a tissue or into your sleeve (not your hands). . Avoid shaking hands with others and consider head nods or verbal greetings only. . Avoid touching your eyes, nose, or mouth with unwashed hands.  . Avoid close contact with people who are sick. . Avoid places or events with large numbers of people in one location, like concerts or sporting events. . Carefully consider travel plans you have or are making. . If you are planning any travel outside or inside the [place], visit the CDC's Travelers' Health webpage for the latest health notices. . If you have some symptoms but not all symptoms, continue to monitor at home and seek medical attention if your symptoms worsen. . If  you are having a medical emergency, call 911.  HOME CARE . Only take medications as instructed by your medical team. . Drink plenty of fluids and get plenty of rest. . A steam or ultrasonic humidifier can help if you have congestion.   GET HELP RIGHT AWAY IF YOU HAVE EMERGENCY WARNING SIGNS** FOR COVID-19. If you or someone is showing any of these signs seek emergency medical care immediately. Call 911 or proceed to your closest emergency  facility if: . You develop worsening high fever. . Trouble breathing . Bluish lips or face . Persistent pain or pressure in the chest . New confusion . Inability to wake or stay awake . You cough up blood. . Your symptoms become more severe  **This list is not all possible symptoms. Contact your medical provider for any symptoms that are sever or concerning to you.   MAKE SURE YOU   Understand these instructions.  Will watch your condition.  Will get help right away if you are not doing well or get worse.  Your e-visit answers were reviewed by a board certified advanced clinical practitioner to complete your personal care plan.  Depending on the condition, your plan could have included both over the counter or prescription medications.  If there is a problem please reply once you have received a response from your provider.  Your safety is important to us.  If you have drug allergies check your prescription carefully.    You can use MyChart to ask questions about today's visit, request a non-urgent call back, or ask for a work or school excuse for 24 hours related to this e-Visit. If it has been greater than 24 hours you will need to follow up with your provider, or enter a new e-Visit to address those concerns. You will get an e-mail in the next two days asking about your experience.  I hope that your e-visit has been valuable and will speed your recovery. Thank you for using e-visits.   I spent 5-10 minutes on review and completion of this note- Illa LevelSahar Macarena Langseth [facility]

## 2019-05-26 NOTE — Telephone Encounter (Signed)
Pt called and left message to return call to schedule COVID-19 testing. Order placed.  

## 2019-05-26 NOTE — Telephone Encounter (Signed)
Call to patient. Left message on voice mail, ok per DPR. VM confirms "Abigail Sawyer." Left message have contacted [facility] to initiate Covid testing. They will contact patient directly.Can call back if questions.  Routing to Dr Quincy Simmonds for review.

## 2019-05-26 NOTE — Telephone Encounter (Signed)
Patient's son was tested for Covid today. He was exposed by personal trainer he has been working with who has tested positive. Patient having some sore throat and head congestion and wanted to know if we could refer to [facility] for testing. Advised she cold go to Health Dept.but she'd rather go through drive through at [facility] was so simple for her son. Will discuss with Dr.Silva and call her back. Routed to provider.(pt.hasn't established a PCP yet--has appt.at end of July).

## 2019-05-26 NOTE — Telephone Encounter (Signed)
Patient's has developed a fever and cough .  She states that her son's friend tested positive for covid-19. She will see a new PCP at the end of this month and asked if our office could refer her for covid-19 testing?

## 2019-05-27 ENCOUNTER — Other Ambulatory Visit: Payer: 59

## 2019-05-27 DIAGNOSIS — Z20822 Contact with and (suspected) exposure to covid-19: Secondary | ICD-10-CM

## 2019-05-27 NOTE — Telephone Encounter (Signed)
Pt. Scheduled for today at [facility].

## 2019-05-27 NOTE — Telephone Encounter (Signed)
Encounter reviewed.  Ok to close. 

## 2019-05-29 ENCOUNTER — Encounter (INDEPENDENT_AMBULATORY_CARE_PROVIDER_SITE_OTHER): Payer: Self-pay

## 2019-05-31 LAB — NOVEL CORONAVIRUS, NAA: SARS-CoV-2, NAA: DETECTED — AB

## 2019-06-04 ENCOUNTER — Ambulatory Visit
Admission: RE | Admit: 2019-06-04 | Discharge: 2019-06-04 | Disposition: A | Discharge status: Home / Self Care | Payer: 59 | Source: Ambulatory Visit | Attending: Obstetrics and Gynecology | Admitting: Obstetrics and Gynecology

## 2019-06-04 DIAGNOSIS — Z1231 Encounter for screening mammogram for malignant neoplasm of breast: Secondary | ICD-10-CM

## 2019-06-06 ENCOUNTER — Encounter (INDEPENDENT_AMBULATORY_CARE_PROVIDER_SITE_OTHER): Payer: Self-pay

## 2020-01-21 ENCOUNTER — Other Ambulatory Visit: Payer: Self-pay

## 2020-01-21 NOTE — Progress Notes (Signed)
54 y.o. [id number] Married Caucasian female here for annual exam.    Needs Vagifem refill.  Still has some discomfort.   Taking a calcium, vit D chew supplement.   Patient had Covid in 2019-05-23.   Father passed away last year.  He had Parkinsons, dementia and Covid.   PCP: Jeremy Johann, MD  Patient's last menstrual period was 05/20/2014.           Sexually active: Yes.    The current method of family planning is post menopausal status.    Exercising: Yes.    plays tennis 1-2x/week, and pelaton bike Smoker:  no  Health Maintenance: Pap: 01-20-19 Neg:Neg HR HPV, 12-27-15 Neg:Neg HR HPV, 10-08-12 Neg:Neg HR HPV History of abnormal Pap:  Yes,Hx cryotherapy at age 9--paps normal since  MMG: 06-04-19 3D/Neg/density B/BiRads1 Colonoscopy: 03-22-15 polyps with [facility];next due 03/2025  BMD:   n/a  Result  n/a TDaP:  12-23-14 Gardasil:   no HIV:01-11-17 NR Hep C:01-11-17 Neg Screening Labs:  PCP.    reports that she has never smoked. She has never used smokeless tobacco. She reports current alcohol use of about 6.0 - 8.0 standard drinks of alcohol per week. She reports that she does not use drugs.  Past Medical History:  Diagnosis Date  . Abnormal Pap smear    in her 13s  . Anxiety   . Elevated hemoglobin A1c 12/27/15   5.7  . Elevated LDL cholesterol level   . History of COVID-19   . Primary HSV infection of mouth     Past Surgical History:  Procedure Laterality Date  . CESAREAN SECTION  2002  . ECTOPIC PREGNANCY SURGERY    . GYNECOLOGIC CRYOSURGERY    . PELVIC LAPAROSCOPY  2001   LSO  . TUBAL LIGATION  2002    Current Outpatient Medications  Medication Sig Dispense Refill  . clonazePAM (KLONOPIN) 0.5 MG tablet Take 1 tablet (0.5 mg total) by mouth 2 (two) times daily as needed for anxiety. 30 tablet 0  . Estradiol 10 MCG TABS vaginal tablet Place 1 tablet (10 mcg total) vaginally 2 (two) times a week. Refills will be for 8 tablets each. 18 tablet 11  . Eszopiclone 3 MG TABS  Take 3 mg by mouth at bedtime.    . valACYclovir (VALTREX) 1000 MG tablet Take 2 tablets (2000 mg) by mouth every 12 hours for 24 hours. 30 tablet 1   No current facility-administered medications for this visit.    Family History  Problem Relation Age of Onset  . Multiple myeloma Maternal Grandmother   . Heart attack Maternal Grandfather   . Hyperlipidemia Mother   . Parkinson's disease Father     Review of Systems  All other systems reviewed and are negative.   Exam:   BP 140/80   Pulse 72   Temp (!) 97.3 F (36.3 C) (Temporal)   Resp 16   Ht 5' 4.5" (1.638 m)   Wt 183 lb (83 kg)   LMP 05/20/2014   BMI 30.93 kg/m     General appearance: alert, cooperative and appears stated age Head: normocephalic, without obvious abnormality, atraumatic Neck: no adenopathy, supple, symmetrical, trachea midline and thyroid normal to inspection and palpation Lungs: clear to auscultation bilaterally Breasts: normal appearance, no masses or tenderness, No nipple retraction or dimpling, No nipple discharge or bleeding, No axillary adenopathy Heart: regular rate and rhythm Abdomen: soft, non-tender; no masses, no organomegaly Extremities: extremities normal, atraumatic, no cyanosis or edema Skin: skin  color, texture, turgor normal. No rashes or lesions Lymph nodes: cervical, supraclavicular, and axillary nodes normal. Neurologic: grossly normal  Pelvic: External genitalia:  no lesions              No abnormal inguinal nodes palpated.              Urethra:  normal appearing urethra with no masses, tenderness or lesions              Bartholins and Skenes: normal                 Vagina: normal appearing vagina with normal color and discharge, no lesions              Cervix: no lesions              Pap taken: No. Bimanual Exam:  Uterus:  normal size, contour, position, consistency, mobility, non-tender              Adnexa: no mass, fullness, tenderness              Rectal exam: Yes.  .   Confirms.              Anus:  normal sphincter tone, no lesions  Chaperone was present for exam.  Assessment:   Well woman visit with normal exam. Remote history of cryotherapy. Insomnia. Hx of GDM. Hx elevated cholesterol. Hx oral HSV.   Plan: Mammogram screening discussed. Self breast awareness reviewed. Pap and HR HPV as above. Guidelines for Calcium, Vitamin D, regular exercise program including cardiovascular and weight bearing exercise. Rx for Vagifem and Valtrex.  I did discuss potential effect of Vagifem on breast cancer.  Follow up annually and prn.   After visit summary provided.

## 2020-01-22 ENCOUNTER — Ambulatory Visit: Payer: BC Managed Care – PPO | Admitting: Obstetrics and Gynecology

## 2020-01-22 ENCOUNTER — Encounter: Payer: Self-pay | Admitting: Obstetrics and Gynecology

## 2020-01-22 VITALS — BP 140/80 | HR 72 | Temp 97.3°F | Resp 16 | Ht 64.5 in | Wt 183.0 lb

## 2020-01-22 DIAGNOSIS — Z01419 Encounter for gynecological examination (general) (routine) without abnormal findings: Secondary | ICD-10-CM | POA: Diagnosis not present

## 2020-01-22 MED ORDER — ESTRADIOL 10 MCG VA TABS: 1.0000 | ORAL_TABLET | VAGINAL | 3 refills | Status: DC

## 2020-01-22 MED ORDER — VALACYCLOVIR HCL 1 G PO TABS: ORAL_TABLET | ORAL | 1 refills | Status: DC

## 2020-01-22 NOTE — Patient Instructions (Signed)

## 2020-03-11 DIAGNOSIS — H5213 Myopia, bilateral: Secondary | ICD-10-CM | POA: Diagnosis not present

## 2020-03-11 DIAGNOSIS — H524 Presbyopia: Secondary | ICD-10-CM | POA: Diagnosis not present

## 2020-05-18 DIAGNOSIS — F419 Anxiety disorder, unspecified: Secondary | ICD-10-CM | POA: Diagnosis not present

## 2020-06-03 ENCOUNTER — Ambulatory Visit (INDEPENDENT_AMBULATORY_CARE_PROVIDER_SITE_OTHER): Payer: BC Managed Care – PPO | Admitting: Sports Medicine

## 2020-06-03 ENCOUNTER — Other Ambulatory Visit: Payer: Self-pay

## 2020-06-03 VITALS — BP 122/84 | Ht 64.0 in | Wt 176.0 lb

## 2020-06-03 DIAGNOSIS — M7581 Other shoulder lesions, right shoulder: Secondary | ICD-10-CM

## 2020-06-03 DIAGNOSIS — S4381XA Sprain of other specified parts of right shoulder girdle, initial encounter: Secondary | ICD-10-CM | POA: Diagnosis not present

## 2020-06-03 DIAGNOSIS — S46211A Strain of muscle, fascia and tendon of other parts of biceps, right arm, initial encounter: Secondary | ICD-10-CM | POA: Diagnosis not present

## 2020-06-03 DIAGNOSIS — S46811A Strain of other muscles, fascia and tendons at shoulder and upper arm level, right arm, initial encounter: Secondary | ICD-10-CM

## 2020-06-03 DIAGNOSIS — M7521 Bicipital tendinitis, right shoulder: Secondary | ICD-10-CM

## 2020-06-03 MED ORDER — NITROGLYCERIN 0.2 MG/HR TD PT24
MEDICATED_PATCH | TRANSDERMAL | 1 refills | Status: DC
Start: 2020-06-03 — End: 2023-03-14

## 2020-06-03 NOTE — Patient Instructions (Addendum)
You have partial tears in your biceps tendon and your subscapularis tendon, which is a part of the rotator cuff.  Please use nitroglycerin patches to help with healing as below, and do the home exercises as described on your worksheet.  Nitroglycerin Protocol   Apply 1/4 nitroglycerin patch to affected area daily.  Change position of patch within the affected area every 24 hours.  You may experience a headache during the first 1-2 weeks of using the patch, these should subside.  If you experience headaches after beginning nitroglycerin patch treatment, you may take your preferred over the counter pain reliever.  Another side effect of the nitroglycerin patch is skin irritation or rash related to patch adhesive.  Please notify our office if you develop more severe headaches or rash, and stop the patch.  Tendon healing with nitroglycerin patch may require 12 to 24 weeks depending on the extent of injury.  Men should not use if taking Viagra, Cialis, or Levitra.   Do not use if you have migraines or rosacea.

## 2020-06-03 NOTE — Progress Notes (Signed)
PCP: Abigail Sawyer, No Pcp Per  Subjective:   HPI: Abigail Sawyer is a 54 y.o. female here for evaluation of right shoulder pain.  She reports that about 6 weeks ago, she was playing tennis and while she was doing and overhead backhand volley, she felt a sudden pulling sensation and pain.  She reports that it is very painful she did finish the match but was very limited in what she could to, and then later that night had significant pain to the point that she was having trouble moving her shoulder.  She also reports some swelling of the shoulder after the injury.  She had some difficulty locating the pain, it was both anterior and posterior shoulder.  She rested for several days, and then started trying to do some mobility exercises at home.  She felt that the pain was slowly improving and try to play tennis again earlier this week with recurrence of severe pain and swelling.  She now presents for further evaluation here.   Past Medical History:  Diagnosis Date  . Abnormal Pap smear    in her 76s  . Anxiety   . Elevated hemoglobin A1c 12/27/15   5.7  . Elevated LDL cholesterol level   . History of COVID-19   . Primary HSV infection of mouth     Current Outpatient Medications on File Prior to Visit  Medication Sig Dispense Refill  . escitalopram (LEXAPRO) 5 MG tablet Take 5 mg by mouth daily.    . clonazePAM (KLONOPIN) 0.5 MG tablet Take 1 tablet (0.5 mg total) by mouth 2 (two) times daily as needed for anxiety. 30 tablet 0  . Estradiol 10 MCG TABS vaginal tablet Place 1 tablet (10 mcg total) vaginally 2 (two) times a week. 24 tablet 3  . Eszopiclone 3 MG TABS Take 3 mg by mouth at bedtime.    . valACYclovir (VALTREX) 1000 MG tablet Take 2 tablets (2000 mg) by mouth every 12 hours for 24 hours. 30 tablet 1   No current facility-administered medications on file prior to visit.    Past Surgical History:  Procedure Laterality Date  . CESAREAN SECTION  2002  . ECTOPIC PREGNANCY SURGERY    .  GYNECOLOGIC CRYOSURGERY    . PELVIC LAPAROSCOPY  2001   LSO  . TUBAL LIGATION  2002    No Known Allergies  Social History   Socioeconomic History  . Marital status: Married    Spouse name: Not on file  . Number of children: Not on file  . Years of education: Not on file  . Highest education level: Not on file  Occupational History  . Not on file  Tobacco Use  . Smoking status: Never Smoker  . Smokeless tobacco: Never Used  Vaping Use  . Vaping Use: Never used  Substance and Sexual Activity  . Alcohol use: Yes    Alcohol/week: 6.0 - 8.0 standard drinks    Types: 6 - 8 Glasses of wine per week  . Drug use: No  . Sexual activity: Yes    Partners: Male    Birth control/protection: Surgical    Comment: tubal  Other Topics Concern  . Not on file  Social History Narrative  . Not on file   Social Determinants of Health   Financial Resource Strain:   . Difficulty of Paying Living Expenses:   Food Insecurity:   . Worried About Charity fundraiser in the Last Year:   . [place] in the Last  Year:   Transportation Needs:   . Film/video editor (Medical):   Marland Kitchen Lack of Transportation (Non-Medical):   Physical Activity:   . Days of Exercise per Week:   . Minutes of Exercise per Session:   Stress:   . Feeling of Stress :   Social Connections:   . Frequency of Communication with Friends and Family:   . Frequency of Social Gatherings with Friends and Family:   . Attends Religious Services:   . Active Member of Clubs or Organizations:   . Attends Archivist Meetings:   Marland Kitchen Marital Status:   Intimate Partner Violence:   . Fear of Current or Ex-Partner:   . Emotionally Abused:   Marland Kitchen Physically Abused:   . Sexually Abused:     Family History  Problem Relation Age of Onset  . Multiple myeloma Maternal Grandmother   . Heart attack Maternal Grandfather   . Hyperlipidemia Mother   . Parkinson's disease Father     BP 122/84   Ht _0  (1.626 m)   Wt 176  lb (79.8 kg)   LMP 05/20/2014   BMI 30.21 kg/m   Review of Systems: Denies neck pain Denies radicular sxs Wakes her up if she rolls onto RT shoulder See HPI above.     Objective:  Physical Exam:  Gen: NAD, comfortable in exam room BP 122/84   Ht _1  (1.626 m)   Wt 176 lb (79.8 kg)   LMP 05/20/2014   BMI 30.21 kg/m    Right Shoulder: Inspection reveals no obvious deformity, atrophy, or asymmetry. No bruising. No swelling Palpation is significant for tenderness to palpation over proximal biceps tendon. ROM: Mildly limited external rotation, about 10 degrees less than contralateral arm.  Very limited internal rotation, flexion 0 to 100 degrees pain above this NV intact distally Normal scapular function observed. Special Tests:  - Impingement: Pos Hawkins,  positive neers, positive empty can sign. - Supraspinatous: Positive empty can.  5/5 strength with resisted abduction at 20 degrees - Infraspinatous/Teres Minor: 5/5 strength with ER - Subscapularis: 4.5/5 strength with IR but limited by significant pain - Biceps tendon: Pos Speeds - Labrum: Negative Obriens, good stability - No drop arm sign, positive painful arc  Ultrasound examination RT. Shoulder  -Biceps tendon with peritendinous edema proximally, along with partial biceps tear at the proximal portion of the biceps tendon -Subscapularis tendon with partial tear on the articular surface - length approx 1 cm, no impingement with dynamic testing -Supraspinatus tendon intact and without any signs of tearing.  Mild hypoechoic change noted in distal tendon foot print -Teres minor/infraspinatus intact without any signs of tearing -AC joint with positive Geyser sign  Impression: Distal subscapularis tear and proximal biceps high grade partial tear  Ultrasound and interpretation by Dagoberto Ligas, MD and  Wolfgang Phoenix. Fields, MD     Assessment & Plan:  1.  Partial biceps tear 2.  Partial subscapularis tear  Abigail Sawyer with  biceps and subscapularis tear seen on ultrasound.  This is consistent with physical exam findings.  We will plan to start home physical therapy, exercises given today.  We will also plan to start nitroglycerin patches on the shoulder.  Follow-up in 4 weeks.

## 2020-06-23 ENCOUNTER — Telehealth: Payer: Self-pay | Admitting: Obstetrics and Gynecology

## 2020-06-23 DIAGNOSIS — F419 Anxiety disorder, unspecified: Secondary | ICD-10-CM | POA: Diagnosis not present

## 2020-06-23 DIAGNOSIS — E7800 Pure hypercholesterolemia, unspecified: Secondary | ICD-10-CM | POA: Diagnosis not present

## 2020-06-23 DIAGNOSIS — Z0000 Encounter for general adult medical examination without abnormal findings: Secondary | ICD-10-CM | POA: Diagnosis not present

## 2020-06-23 DIAGNOSIS — F5101 Primary insomnia: Secondary | ICD-10-CM | POA: Diagnosis not present

## 2020-06-23 NOTE — Telephone Encounter (Signed)
Patient wants to know when hr last tetanus shot was.

## 2020-06-23 NOTE — Telephone Encounter (Signed)
Spoke with pt. Pt asking when last tdap was given. Per our records, last tdap was 12/23/2014 and is due to have next one 12/2024. Pt agreeable and thankful for call.   Encounter closed.

## 2020-07-15 ENCOUNTER — Encounter: Payer: Self-pay | Admitting: Sports Medicine

## 2020-07-15 ENCOUNTER — Ambulatory Visit: Payer: BC Managed Care – PPO | Admitting: Sports Medicine

## 2020-07-15 ENCOUNTER — Other Ambulatory Visit: Payer: Self-pay

## 2020-07-15 VITALS — BP 112/78 | Ht 65.0 in | Wt 178.0 lb

## 2020-07-15 DIAGNOSIS — S46211A Strain of muscle, fascia and tendon of other parts of biceps, right arm, initial encounter: Secondary | ICD-10-CM | POA: Diagnosis not present

## 2020-07-15 DIAGNOSIS — S4381XA Sprain of other specified parts of right shoulder girdle, initial encounter: Secondary | ICD-10-CM | POA: Diagnosis not present

## 2020-07-15 DIAGNOSIS — S46811A Strain of other muscles, fascia and tendons at shoulder and upper arm level, right arm, initial encounter: Secondary | ICD-10-CM

## 2020-07-15 NOTE — Progress Notes (Signed)
   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  At last visit on 7/15, patient was found to have a partial tear of her biceps tendon and subscapularis tendon after an overhead backhand volley while she was playing tennis.  She was treated with home physical therapy, nitroglycerin patches.  Today, patient states that she has significant improvement in her symptoms.  She continues to have some very mild pain when she is lifting heavy objects or reaching overhead however this is significantly improved since her last visit.  She has been using nitroglycerin patches daily, and does not have any symptoms as long she does not use more than a quarter of the patch.  She has not been playing tennis as promised.  Review of Systems:  Per HPI.   PMFSH, medications and smoking status reviewed.      Objective:  Physical Exam: BP 112/78   Ht 5\' 5"  (1.651 m)   Wt 178 lb (80.7 kg)   LMP 05/20/2014   BMI 29.62 kg/m   Gen: awake, alert, NAD, comfortable in exam room Pulm: breathing unlabored  R Shoulder: -Inspection: no obvious deformity, atrophy, or asymmetry. No bruising. No swelling -Palpation: no TTP over [facility] joint, mildly tender over bicipital groove. -ROM: Full ROM in abduction, flexion, internal/external rotation both passively and actively  NV intact distally Normal scapular function observed. Special Tests:  - Impingement: Neg Hawkins, Neg neers, Neg empty can sign. - Supraspinatus: Negative empty can.  5/5 strength with resisted flexion at 20 degrees - Infraspinatus/Teres Minor: 5/5 strength with ER - Subscapularis: 5/5 strength with IR - Biceps tendon: Neg Speeds, Neg Yerrgason's  - Labrum: Negative Obriens, good stability - No painful arc and no drop arm sign  [facility] shoulder: Right -Biceps tendon: Well visualized within the bicipital groove with a very small area of edema around the proximal  aspect of the tendon. -Pectoralis: Insertion visualized and without abnormalities. -Subscapularis: Well visualized to insertion point on humerus.  There is a small partial tear in the distal 3 to 5 mm of the tendon at its articular surface. Dynamic testing over the coracoid did not show signs of impingement. -AC joint: No osteophytes, no significant separation, negative geyser sign. -Supraspinatus: Well-visualized and without any abnormalities.  Dynamic testing did not reveal signs of impingement. -Subacromial bursa: No obvious enlargement or swelling. -Infraspinatus/teres minor: Insertion point on posterior humerus visualized and without abnormalities. Conclusion: 1.  Mild biceps peritendinous edema, significantly improved from previous exam 2.  Small partial tear of the subscapularis, improved from previous exam  Ultrasound and interpretation by Dr. [place] and Alfredo Bach. Fields, MD     Assessment & Plan:  1.  Partial biceps tear 2.  Partial subscapularis tear  Patient with significant improvement in symptoms since last visit and her ultrasound is also significantly improved.  Given the magnitude of improvement, we will plan to start back with simple tennis exercises, namely no overhead and no far reaching or late hits.  We will continue with nitroglycerin patches and follow-up in 4 to 6 weeks to reevaluate.    Sibyl Parr, MD [facility] Fellow 07/15/2020 5:20 PM   I observed and examined the patient with the [facility] Fellow and agree with assessment and plan.  Note reviewed and modified by me. [facility], MD

## 2020-08-17 ENCOUNTER — Ambulatory Visit: Payer: BC Managed Care – PPO | Admitting: Sports Medicine

## 2020-09-23 ENCOUNTER — Other Ambulatory Visit: Payer: Self-pay | Admitting: Obstetrics and Gynecology

## 2020-09-23 DIAGNOSIS — Z1231 Encounter for screening mammogram for malignant neoplasm of breast: Secondary | ICD-10-CM

## 2020-09-30 ENCOUNTER — Other Ambulatory Visit: Payer: Self-pay

## 2020-09-30 ENCOUNTER — Ambulatory Visit
Admission: RE | Admit: 2020-09-30 | Discharge: 2020-09-30 | Disposition: A | Discharge status: Home / Self Care | Payer: BC Managed Care – PPO | Source: Ambulatory Visit | Attending: Obstetrics and Gynecology | Admitting: Obstetrics and Gynecology

## 2020-09-30 DIAGNOSIS — Z1231 Encounter for screening mammogram for malignant neoplasm of breast: Secondary | ICD-10-CM

## 2020-11-01 ENCOUNTER — Other Ambulatory Visit: Payer: Self-pay | Admitting: Obstetrics and Gynecology

## 2020-11-01 NOTE — Telephone Encounter (Signed)
Medication refill request: Valtrex Last AEX:  01/22/20 Dr. Edward Jolly Next AEX: 01/26/21 Last MMG (if hormonal medication request): n/a Refill authorized: today, please advise

## 2020-12-27 DIAGNOSIS — E7800 Pure hypercholesterolemia, unspecified: Secondary | ICD-10-CM | POA: Diagnosis not present

## 2020-12-27 DIAGNOSIS — F5101 Primary insomnia: Secondary | ICD-10-CM | POA: Diagnosis not present

## 2020-12-27 DIAGNOSIS — F419 Anxiety disorder, unspecified: Secondary | ICD-10-CM | POA: Diagnosis not present

## 2021-01-25 ENCOUNTER — Ambulatory Visit: Payer: Self-pay | Admitting: Obstetrics and Gynecology

## 2021-01-26 ENCOUNTER — Ambulatory Visit: Payer: Self-pay | Admitting: Obstetrics and Gynecology

## 2021-06-28 DIAGNOSIS — Z0000 Encounter for general adult medical examination without abnormal findings: Secondary | ICD-10-CM | POA: Diagnosis not present

## 2021-06-28 DIAGNOSIS — E7800 Pure hypercholesterolemia, unspecified: Secondary | ICD-10-CM | POA: Diagnosis not present

## 2021-06-28 DIAGNOSIS — F419 Anxiety disorder, unspecified: Secondary | ICD-10-CM | POA: Diagnosis not present

## 2021-06-28 DIAGNOSIS — F5101 Primary insomnia: Secondary | ICD-10-CM | POA: Diagnosis not present

## 2021-09-15 ENCOUNTER — Other Ambulatory Visit: Payer: Self-pay | Admitting: Obstetrics and Gynecology

## 2021-09-15 DIAGNOSIS — Z1231 Encounter for screening mammogram for malignant neoplasm of breast: Secondary | ICD-10-CM

## 2021-10-20 ENCOUNTER — Ambulatory Visit
Admission: RE | Admit: 2021-10-20 | Discharge: 2021-10-20 | Disposition: A | Discharge status: Home / Self Care | Payer: 59 | Source: Ambulatory Visit | Attending: Obstetrics and Gynecology | Admitting: Obstetrics and Gynecology

## 2021-10-20 ENCOUNTER — Other Ambulatory Visit: Payer: Self-pay

## 2021-10-20 DIAGNOSIS — Z1231 Encounter for screening mammogram for malignant neoplasm of breast: Secondary | ICD-10-CM

## 2022-01-05 ENCOUNTER — Other Ambulatory Visit: Payer: Self-pay

## 2022-01-05 ENCOUNTER — Encounter: Payer: Self-pay | Admitting: Obstetrics and Gynecology

## 2022-01-05 ENCOUNTER — Ambulatory Visit (INDEPENDENT_AMBULATORY_CARE_PROVIDER_SITE_OTHER): Payer: 59 | Admitting: Obstetrics and Gynecology

## 2022-01-05 VITALS — BP 120/78 | HR 81 | Ht 64.25 in | Wt 169.0 lb

## 2022-01-05 DIAGNOSIS — Z01419 Encounter for gynecological examination (general) (routine) without abnormal findings: Secondary | ICD-10-CM | POA: Diagnosis not present

## 2022-01-05 MED ORDER — ESTRADIOL 10 MCG VA TABS: 1.0000 | ORAL_TABLET | VAGINAL | 3 refills | Status: DC

## 2022-01-05 NOTE — Progress Notes (Signed)
56 y.o. [id number] Married Caucasian female here for annual exam.    Vagifem is working well.   Declines a refill of Valtrex.   Having decreased range of motion of neck after playing a lot of tennis.  Has seen Dr. Hector Shade in the past.   Children are out of home and in college.  Working part time.  Husband is now semi-retired.   PCP:  Carol Ada, MD   Patient's last menstrual period was 05/20/2014.           Sexually active: Yes.    The current method of family planning is Tubal/post menopausal status.    Exercising: Yes.     Walking, tennis, weights Smoker:  no  Health Maintenance: Pap:  01-20-19 Neg:Neg HR HPV, 12-27-15 Neg:Neg HR HPV, 10-08-12 Neg:Neg HR HPV History of abnormal Pap:  Yes, Hx cryotherapy at age 37--paps normal since  MMG:  10-20-21 Neg/BiRads1 Colonoscopy:  03-22-15 polyps removed;next 03/2025 BMD:   n/a  Result  n/a TDaP:  12-23-14 Gardasil:   no HIV:01-11-17 Hep C:01-11-17 Screening Labs: PCP. Flu vaccine:  completed.  Covid:  one booster.    reports that she has never smoked. She has never used smokeless tobacco. She reports current alcohol use of about 6.0 - 8.0 standard drinks per week. She reports that she does not use drugs.  Past Medical History:  Diagnosis Date   Abnormal Pap smear    in her 50s   Anxiety    Elevated hemoglobin A1c 12/27/2015   5.7   Elevated LDL cholesterol level    History of COVID-19    05/2019, 06/2021   Primary HSV infection of mouth     Past Surgical History:  Procedure Laterality Date   CESAREAN SECTION  2002   ECTOPIC PREGNANCY SURGERY     GYNECOLOGIC CRYOSURGERY     PELVIC LAPAROSCOPY  2001   LSO   TUBAL LIGATION  2002    Current Outpatient Medications  Medication Sig Dispense Refill   calcium gluconate 500 MG tablet Take 1 tablet by mouth daily.     clonazePAM (KLONOPIN) 0.5 MG tablet 1 tablet at bedtime.     Estradiol 10 MCG TABS vaginal tablet Place 1 tablet (10 mcg total) vaginally 2 (two) times a week.  24 tablet 3   Eszopiclone 3 MG TABS 1 tablet immediately before bedtime     nitroGLYCERIN (NITRODUR - DOSED IN MG/24 HR) 0.2 mg/hr patch Use 1/4 patch daily to the affected area. 30 patch 1   valACYclovir (VALTREX) 1000 MG tablet TAKE 2 TABLETS(2000 MG) BY MOUTH EVERY 12 HOURS FOR 24 HOURS 30 tablet 1   No current facility-administered medications for this visit.    Family History  Problem Relation Age of Onset   Hyperlipidemia Mother    Alzheimer's disease Mother    Parkinson's disease Father    Multiple myeloma Maternal Grandmother    Heart attack Maternal Grandfather     Review of Systems  All other systems reviewed and are negative.  Exam:   BP 120/78    Pulse 81    Ht 5' 4.25" (1.632 m)    Wt 169 lb (76.7 kg)    LMP 05/20/2014    SpO2 97%    BMI 28.78 kg/m     General appearance: alert, cooperative and appears stated age Head: normocephalic, without obvious abnormality, atraumatic Neck: no adenopathy, supple, symmetrical, trachea midline and thyroid normal to inspection and palpation Lungs: clear to auscultation bilaterally Breasts: normal appearance,  no masses or tenderness, No nipple retraction or dimpling, No nipple discharge or bleeding, No axillary adenopathy Heart: regular rate and rhythm Abdomen: soft, non-tender; no masses, no organomegaly Extremities: extremities normal, atraumatic, no cyanosis or edema Skin: skin color, texture, turgor normal. No rashes or lesions Lymph nodes: cervical, supraclavicular, and axillary nodes normal. Neurologic: grossly normal  Pelvic: External genitalia:  no lesions              No abnormal inguinal nodes palpated.              Urethra:  normal appearing urethra with no masses, tenderness or lesions              Bartholins and Skenes: normal                 Vagina: normal appearing vagina with normal color and discharge, no lesions              Cervix: no lesions              Pap taken: no Bimanual Exam:  Uterus:  normal size,  contour, position, consistency, mobility, non-tender              Adnexa: no mass, fullness, tenderness              Rectal exam: yes.  Confirms.              Anus:  normal sphincter tone, no lesions  Chaperone was present for exam:  Estill Bamberg, CMA  Assessment:   Well woman visit with gynecologic exam. Remote history of cryotherapy. Vaginal atrophy.  Hx of GDM. Hx elevated cholesterol. Hx oral HSV.  Hx Covid.   Plan: Mammogram screening discussed. Self breast awareness reviewed. Pap and HR HPV as above. Guidelines for Calcium, Vitamin D, regular exercise program including cardiovascular and weight bearing exercise. Refill of Vagifem for one year.  I discussed potential effect on breast cancer. Labs with PCP. Follow up annually and prn.    After visit summary provided.

## 2022-01-05 NOTE — Patient Instructions (Signed)

## 2022-06-15 ENCOUNTER — Ambulatory Visit (INDEPENDENT_AMBULATORY_CARE_PROVIDER_SITE_OTHER): Payer: 59 | Admitting: Sports Medicine

## 2022-06-15 ENCOUNTER — Ambulatory Visit: Payer: Self-pay

## 2022-06-15 VITALS — BP 118/57 | Ht 65.0 in | Wt 163.0 lb

## 2022-06-15 DIAGNOSIS — M542 Cervicalgia: Secondary | ICD-10-CM

## 2022-06-15 DIAGNOSIS — G5603 Carpal tunnel syndrome, bilateral upper limbs: Secondary | ICD-10-CM

## 2022-06-15 MED ORDER — GABAPENTIN 600 MG PO TABS: 300.0000 mg | ORAL_TABLET | Freq: Every day | ORAL | 2 refills | Status: DC

## 2022-06-16 NOTE — Progress Notes (Unsigned)
Abigail Sawyer - 56 y.o. female MRN [id number]  Date of birth: 1966/02/06    CHIEF COMPLAINT:   Bilateral wrist pain and neck pain    SUBJECTIVE:   HPI: Pleasant patient comes in to discuss bilateral wrist pain and neck pain.  In regards her neck pain, she says has been present for about 6 months.  She says she feels stiff with twisting the neck to the left or the right.  She especially feels this when she is driving in her car and has to look over her shoulder.  She occasionally gets a sharp pain that shoots up from her neck into the base of the head which can cause a headache. She got a massage in her neck but that did not help.  She denies any radiation of the pain from the neck down into the arms.  She denies any trauma to the neck.  She has not tried any medications for this.  In regards to her bilateral wrist pain she says this has been present for about 2 months.  She describes it as a numbness and tingling that starts in the wrist and radiates down into all of her fingers bilaterally.  She notices it when working on the computer, holding her cell phone, and especially when she is playing tennis or golf.  She says her wrist feels the worst at night.  When this happens she stands up out of bed and makes it feel better.  She reports prior history of carpal tunnel many years ago.  She has worn wrist brace at home but she is not been wearing it at all.  She has not tried any medications for this.   ROS:     See HPI  PERTINENT  PMH / PSH FH / / SH:  Past Medical, Surgical, Social, and Family History Reviewed & Updated in the EMR.  Pertinent findings include:  Carpal tunnel many years ago  OBJECTIVE: BP (!) 118/57   Ht 5\' 5"  (1.651 m)   Wt 163 lb (73.9 kg)   LMP 05/20/2014   BMI 27.12 kg/m   Physical Exam:  Vital signs are reviewed.  GEN: Alert and oriented, NAD Pulm: Breathing unlabored PSY: normal mood, congruent affect  MSK: Cervical neck -on inspection there is no obvious  misalignment of her cervical vertebrae but she does have several degrees of scapular protraction.  She is nontender to palpation over her cervical spinous processes and there is no bony step-off.  She is tender over her left and right trapezius muscles which elicits a sharp shooting pain.  She has full range of motion in cervical flexion, extension, and lateral twisting but she does report this feels stiff.  She has negative Spurling test bilaterally.  She has symmetric scapular rotation bilaterally.  Hands and wrists -no inspection there is no swelling, ecchymoses, or erythema over the wrists or hands bilaterally.  She has no tenderness to palpation along any of her metacarpals or MCP joints.  She has a positive Tinel's sign on the right, but a negative test on the left.  She has a positive Phalen's test bilaterally.  She has 5/5 grip strength in her hands bilaterally.  She is neurovascular intact distally  ULTRASOUND: L Wrist -left wrist was visualized in longitudinal and short axis. Left median nerve had a cross-sectional area of 0.14 cm.  There is no edema around the nerve.  R Wrist -right wrist was visualized longitudinal and short axis.  The right median nerve cross-sectional  area 0.15 cm.  There is no edema around the nerve.  Of note she also has an accessory abductor pollicis brevis in the 1st compartment.  Impression: Ultrasound consistent with bilateral early carpal tunnel syndrome  Ultrasound and interpretation by Dr. Jordan Hawks and Sibyl Parr. Fields, MD   ASSESSMENT & PLAN:  1. Cervical Neck Pain -We discussed with the patient that her neck pain is likely due to muscle spasms and is not neurogenic in nature given her lack of radicular symptoms and negative Spurling's test bilaterally.  We will give her isometric neck and posture exercises to work on retracting her scapulas in an effort to improve her posture.  If she does not improve with these exercises I recommend obtaining x-rays of her  cervical spine with the potential addition of trigger point injections given her marked point tenderness on exam today.  2.  Bilateral carpal tunnel -Her history, exam, and ultrasound findings are consistent with bilateral carpal tunnel.  She has not made any efforts at conservative treatments yet.  We will start by placing her in night splints.  She already has one at home so she was only given 1 in clinic today.  We will also start her on 300 mg of gabapentin to start at night as well as 100 mg of vitamin B6.  She will follow-up in 1 month.  If no improvement I recommend a corticosteroid injection under ultrasound guidance at the time.   Arvella Nigh, MD PGY-4, Sports Medicine Fellow [facility]  I observed and examined the patient with the [facility] resident and agree with assessment and plan.  Note reviewed and modified by me. Vedia Coffer

## 2022-07-18 ENCOUNTER — Ambulatory Visit: Payer: 59 | Admitting: Sports Medicine

## 2022-08-01 ENCOUNTER — Ambulatory Visit (INDEPENDENT_AMBULATORY_CARE_PROVIDER_SITE_OTHER): Payer: 59 | Admitting: Sports Medicine

## 2022-08-01 DIAGNOSIS — G5603 Carpal tunnel syndrome, bilateral upper limbs: Secondary | ICD-10-CM

## 2022-08-01 DIAGNOSIS — G5600 Carpal tunnel syndrome, unspecified upper limb: Secondary | ICD-10-CM | POA: Insufficient documentation

## 2022-08-01 MED ORDER — GABAPENTIN 600 MG PO TABS: 600.0000 mg | ORAL_TABLET | Freq: Every day | ORAL | 2 refills | Status: DC

## 2022-08-01 NOTE — Progress Notes (Signed)
Cc: F/U of bilateral carpal tunnel  With night splint and GB all sxs gone on left hand On RT hand using a smaller splint and that one is not better and has some constant numbness and tingling in thumb, index and middle fingers Feels that RT hand grip is intermittently weak  Did not get the Vit B 6 yet  PE Pleasant w F in NAD BP 118/76   Ht 5\' 5"  (1.651 m)   Wt 162 lb (73.5 kg)   LMP 05/20/2014   BMI 26.96 kg/m   Left hand Neg tinels Neg phalen Grip strength was excellent  Right hand Negative Tinel's and negative Phalen's Grip strength was good but perhaps somewhat weak on pinch of little finger and thumb Tingling along thumb and index finger

## 2022-08-01 NOTE — Assessment & Plan Note (Signed)
For the left she will keep doing the night splint for another 4 to 6 weeks For the right with increased symptoms were getting increase her gabapentin to 600 at night Start vitamin B6 100 mg daily We gave her a more effective cock-up splint and she is can use it in the day and some at night  If this does not improve in 1 month I will do an injection and hydrodissection of the median nerve on the right

## 2022-08-17 ENCOUNTER — Encounter: Payer: Self-pay | Admitting: Sports Medicine

## 2022-08-17 ENCOUNTER — Ambulatory Visit: Payer: Self-pay

## 2022-08-17 ENCOUNTER — Ambulatory Visit (INDEPENDENT_AMBULATORY_CARE_PROVIDER_SITE_OTHER): Payer: 59 | Admitting: Sports Medicine

## 2022-08-17 VITALS — BP 134/81

## 2022-08-17 DIAGNOSIS — G5601 Carpal tunnel syndrome, right upper limb: Secondary | ICD-10-CM

## 2022-08-17 MED ORDER — METHYLPREDNISOLONE ACETATE 40 MG/ML IJ SUSP
40.0000 mg | Freq: Once | INTRAMUSCULAR | Status: AC
Start: 2022-08-17 — End: 2022-08-17
  Administered 2022-08-17: 40 mg via INTRA_ARTICULAR

## 2022-08-17 NOTE — Progress Notes (Signed)
  Abigail Sawyer - 56 y.o. female MRN [id number]  Date of birth: 01-04-1966    CHIEF COMPLAINT:   Carpal tunnel syndrome RT hand    SUBJECTIVE:   HPI:  Still continues to have numbness in first three fingers on right hand. Denies any pain, and reports she's been able to play tennis. Appreciates some soreness in knuckles of right hand. Not having huge issues in activity level, but numbness is always there. Patient is taking the B6 and using the splint most of the day. Appreciates that sometimes while playing tennis she will drop the racket, or she has to focus on the racket while hitting/using it.  Wrist brace and gabapentin cleared up the left side pain    ROS:     See HPI  PERTINENT  PMH / PSH FH / / SH:  Past Medical, Surgical, Social, and Family History Reviewed & Updated in the EMR.  Pertinent findings include:    OBJECTIVE: BP 134/81   LMP 05/20/2014   Physical Exam:  Vital signs are reviewed.  GEN: Alert and oriented, NAD Pulm: Breathing unlabored PSY: normal mood, congruent affect  MSK: Right Wrist: No deformity, ecchymosis, or erythema, no atrophy of thenar/hypothenar eminence, no TTP, positive phalen and reverse phalen with good grip strength  Ultrasound Guided Injection Patient with hand supinated and palm up Median nerve visualized and is enlarged Prep with alcohol Topical spray with Ethyl Chloride Wheal of 1% lidocaine placed at ular aspect of ulnocarpal joint Under direct visualization a 1.5 inch 25 gauge needle is advanced to the edge of the median nerve Injection and hydrodissection with 1 cc solumedrol 40 and 2 cc lidocaine 1% completed Following the injection with wrist flexion Median nerve showed good motion  Procedure: Hydrodissecton of right median nerve  Completed without difficulty  Ila Mcgill, MD   ASSESSMENT & PLAN:  1. Right Carpal Tunnel Syndrome Patient reports continued numbness and tingling in first three digits, with no relief from the  gabapentin/B6 use. Patient underwent US guided injection of peri-median nerve, and tolerated procedure well. -Continue to wear splint at night -Continue B6, stop gabapentin -F/u 1 month and repeat measure of median nerve   Holley Bouche, MD PGY-2, [facility] Resident [place]  I observed and examined the patient with the resident and agree with assessment and plan.  Note reviewed and modified by me. Ila Mcgill, MD

## 2022-09-06 ENCOUNTER — Ambulatory Visit: Payer: BC Managed Care – PPO | Admitting: Sports Medicine

## 2022-09-06 ENCOUNTER — Ambulatory Visit: Payer: Self-pay

## 2022-09-06 VITALS — BP 120/82 | Ht 65.0 in | Wt 162.0 lb

## 2022-09-06 DIAGNOSIS — M25561 Pain in right knee: Secondary | ICD-10-CM

## 2022-09-06 NOTE — Progress Notes (Unsigned)
Abigail Sawyer - 56 y.o. female MRN [id number]  Date of birth: 09-12-1966    CHIEF COMPLAINT:   R knee pain    SUBJECTIVE:   HPI:  Very pleasant 56 year old female comes to clinic to be evaluated for right knee pain.  She has had ongoing right hamstring weakness and tightness for the last several weeks with playing tennis.  Yesterday while chasing her new puppy up the stairs, she felt her right knee "gave out" on her.  She denies any twisting mechanism.  She denies any pop sensation.  The knee became acutely painful and she had difficulty weightbearing on it.  She localizes all of her pain to the distal and lateral aspect of the knee, more so over the fibular head.  She denies any swelling or bruising.  She denies any locking or catching sensation.  Denies any numbness or tingling down the leg.  She has been walking with a limp.  She has not tried any medications.  ROS:     See HPI  PERTINENT  PMH / PSH FH / / SH:  Past Medical, Surgical, Social, and Family History Reviewed & Updated in the EMR.  Pertinent findings include:  None  OBJECTIVE: BP 120/82   Ht 5\' 5"  (1.651 m)   Wt 162 lb (73.5 kg)   LMP 05/20/2014   BMI 26.96 kg/m   Physical Exam:  Vital signs are reviewed.  GEN: Alert and oriented, NAD Pulm: Breathing unlabored PSY: normal mood, congruent affect  MSK: Right knee -no obvious effusion.  No erythema or bruising.  She is nontender to palpation at the medial or lateral joint line.  She is nontender over the patella.  She is tender to palpation at the fibular head.  She has full range of motion in the knee in flexion and extension.  5/5 strength in extension 4/5 strength in flexion.  4/5 strength in right hip abductors.  She has no ligamentous instability with valgus varus stressing.  Negative Lachman's.  She has a negative McMurray's.  She has an equivocal Thessaly test.  Neurovascular intact distally  ULTRASOUND: MSK ultrasound knee: Images were obtained both in the  transverse and longitudinal plane. Patellar and quadriceps tendons were well visualized with no abnormalities. Small suprapatellar effusion. Medial and lateral menisci were well visualized with no abnormalities. Biceps femoris tendon was well visualized with no hypoechoic changes or tears visualized.  Impression: Unremarkable distal biceps femoris tendon with incidental finding of small suprapatellar effusion   ASSESSMENT & PLAN:  1.  Right leg pain, likely secondary to hamstring weakness  -The etiology of her symptoms is somewhat difficult to ascertain.  She localizes all of her pain distal to the lateral knee joint over the fibular head.  She does have some notable hamstring tightness and weakness.  I think most of her issues are coming from her hamstrings and not truly intra-articular.  She did have a small several millimeter effusion was picked up on ultrasound today that may or may not be clinically relevant.  Her lateral meniscus was well visualized on ultrasound and did not have any obvious tears.  Regardless, we will treat this conservatively with oral anti-inflammatories, compression, gentle hamstring strengthening exercises.  She will take a break from tennis.  If this is not improved in 2 weeks, I recommend she come back to clinic to be reevaluated.  At that time I would rescan her knee to look for continued presence of effusion and will consider an MRI at that time.  All questions answered she agrees to plan.  Arvella Nigh, MD PGY-4, Sports Medicine Fellow [facility]

## 2022-10-05 ENCOUNTER — Other Ambulatory Visit: Payer: Self-pay | Admitting: Family Medicine

## 2022-10-05 DIAGNOSIS — Z1231 Encounter for screening mammogram for malignant neoplasm of breast: Secondary | ICD-10-CM

## 2022-11-27 ENCOUNTER — Ambulatory Visit: Payer: BC Managed Care – PPO

## 2022-11-29 ENCOUNTER — Ambulatory Visit
Admission: RE | Admit: 2022-11-29 | Discharge: 2022-11-29 | Disposition: A | Discharge status: Home / Self Care | Payer: 59 | Source: Ambulatory Visit | Attending: Family Medicine | Admitting: Family Medicine

## 2022-11-29 DIAGNOSIS — Z1231 Encounter for screening mammogram for malignant neoplasm of breast: Secondary | ICD-10-CM

## 2022-12-25 NOTE — Progress Notes (Deleted)
57 y.o. [id number] Married Caucasian female here for annual exam.    PCP:     Patient's last menstrual period was 05/20/2014.           Sexually active: {yes no:314532}  The current method of family planning is BTL/post menopausal status.    Exercising: {yes no:314532}  {types:19826} Smoker:  no  Health Maintenance: Pap:  01/20/19 neg: HR HPV neg History of abnormal Pap:  yes, Hx cryotherapy at age 38--paps normal since   MMG:  11/29/22 Breast Density Category B, BI-RADS CATEGORY 1 neg Colonoscopy:  03/22/15 BMD:   n/a  Result  n/a TDaP:  12/23/14 Gardasil:   no HIV: 01/11/17 NR Hep C: 01/11/17 neg Screening Labs:  Hb today: ***, Urine today: ***   reports that she has never smoked. She has never used smokeless tobacco. She reports current alcohol use of about 6.0 - 8.0 standard drinks of alcohol per week. She reports that she does not use drugs.  Past Medical History:  Diagnosis Date   Abnormal Pap smear    in her 47s   Anxiety    Elevated hemoglobin A1c 12/27/2015   5.7   Elevated LDL cholesterol level    History of COVID-19    05/2019, 06/2021   Primary HSV infection of mouth     Past Surgical History:  Procedure Laterality Date   CESAREAN SECTION  2002   ECTOPIC PREGNANCY SURGERY     GYNECOLOGIC CRYOSURGERY     PELVIC LAPAROSCOPY  2001   LSO   TUBAL LIGATION  2002    Current Outpatient Medications  Medication Sig Dispense Refill   calcium gluconate 500 MG tablet Take 1 tablet by mouth daily.     clonazePAM (KLONOPIN) 0.5 MG tablet 1 tablet at bedtime.     Estradiol 10 MCG TABS vaginal tablet Place 1 tablet (10 mcg total) vaginally 2 (two) times a week. 24 tablet 3   Eszopiclone 3 MG TABS 1 tablet immediately before bedtime     gabapentin (NEURONTIN) 600 MG tablet Take 1 tablet (600 mg total) by mouth at bedtime. 30 tablet 2   nitroGLYCERIN (NITRODUR - DOSED IN MG/24 HR) 0.2 mg/hr patch Use 1/4 patch daily to the affected area. 30 patch 1   valACYclovir (VALTREX) 1000 MG  tablet TAKE 2 TABLETS(2000 MG) BY MOUTH EVERY 12 HOURS FOR 24 HOURS 30 tablet 1   No current facility-administered medications for this visit.    Family History  Problem Relation Age of Onset   Hyperlipidemia Mother    Alzheimer's disease Mother    Parkinson's disease Father    Multiple myeloma Maternal Grandmother    Heart attack Maternal Grandfather     Review of Systems  Exam:   LMP 05/20/2014     General appearance: alert, cooperative and appears stated age Head: normocephalic, without obvious abnormality, atraumatic Neck: no adenopathy, supple, symmetrical, trachea midline and thyroid normal to inspection and palpation Lungs: clear to auscultation bilaterally Breasts: normal appearance, no masses or tenderness, No nipple retraction or dimpling, No nipple discharge or bleeding, No axillary adenopathy Heart: regular rate and rhythm Abdomen: soft, non-tender; no masses, no organomegaly Extremities: extremities normal, atraumatic, no cyanosis or edema Skin: skin color, texture, turgor normal. No rashes or lesions Lymph nodes: cervical, supraclavicular, and axillary nodes normal. Neurologic: grossly normal  Pelvic: External genitalia:  no lesions              No abnormal inguinal nodes palpated.  Urethra:  normal appearing urethra with no masses, tenderness or lesions              Bartholins and Skenes: normal                 Vagina: normal appearing vagina with normal color and discharge, no lesions              Cervix: no lesions              Pap taken: {yes no:314532} Bimanual Exam:  Uterus:  normal size, contour, position, consistency, mobility, non-tender              Adnexa: no mass, fullness, tenderness              Rectal exam: {yes no:314532}.  Confirms.              Anus:  normal sphincter tone, no lesions  Chaperone was present for exam:  ***  Assessment:   Well woman visit with gynecologic exam.   Plan: Mammogram screening discussed. Self  breast awareness reviewed. Pap and HR HPV as above. Guidelines for Calcium, Vitamin D, regular exercise program including cardiovascular and weight bearing exercise.   Follow up annually and prn.   Additional counseling given.  {yes [id number]. _______ minutes face to face time of which over 50% was spent in counseling.    After visit summary provided.

## 2023-01-08 ENCOUNTER — Ambulatory Visit: Payer: 59 | Admitting: Obstetrics and Gynecology

## 2023-03-01 NOTE — Progress Notes (Signed)
57 y.o. [id number] Married Caucasian female here for annual exam.    Using vaginal estrogen and wants to continue.   Husband is retired.  They now have a golden doodle.   PCP:   Dr. [name]  Patient's last menstrual period was 05/20/2014.           Sexually active: Yes.    The current method of family planning is post menopausal status/BTL.    Exercising: Yes.     Tennis , recovering from hamstring injury currently Smoker:  no  Health Maintenance: Pap:  01/20/19 neg: HR HPV neg, 12/27/15 neg: HR HPV neg History of abnormal Pap:  yes, Hx cryotherapy at age 72--paps normal since   MMG:  11/29/22 Breast Density Cat B, BI-RADS CAT 1 neg Colonoscopy:  03/22/15 - due in 2026. BMD:   n/a  Result  n/a TDaP:  12/23/14 Gardasil:   no HIV: 01/11/17 Hep C: 01/11/17 neg Screening Labs: PCP   reports that she has never smoked. She has never used smokeless tobacco. She reports current alcohol use of about 6.0 - 8.0 standard drinks of alcohol per week. She reports that she does not use drugs.  Past Medical History:  Diagnosis Date   Abnormal Pap smear    in her 5520s   Anxiety    Elevated hemoglobin A1c 12/27/2015   5.7   Elevated LDL cholesterol level    History of COVID-19    05/2019, 06/2021   Primary HSV infection of mouth     Past Surgical History:  Procedure Laterality Date   CESAREAN SECTION  2002   ECTOPIC PREGNANCY SURGERY     GYNECOLOGIC CRYOSURGERY     PELVIC LAPAROSCOPY  2001   LSO   TUBAL LIGATION  2002    Current Outpatient Medications  Medication Sig Dispense Refill   calcium gluconate 500 MG tablet Take 1 tablet by mouth daily.     clonazePAM (KLONOPIN) 0.5 MG tablet 1 tablet at bedtime.     Estradiol 10 MCG TABS vaginal tablet Place 1 tablet (10 mcg total) vaginally 2 (two) times a week. 24 tablet 3   valACYclovir (VALTREX) 1000 MG tablet TAKE 2 TABLETS(2000 MG) BY MOUTH EVERY 12 HOURS FOR 24 HOURS 30 tablet 1   No current facility-administered medications for this  visit.    Family History  Problem Relation Age of Onset   Hyperlipidemia Mother    Alzheimer's disease Mother    Parkinson's disease Father    Multiple myeloma Maternal Grandmother    Heart attack Maternal Grandfather     Review of Systems  All other systems reviewed and are negative.   Exam:   BP 126/86 (BP Location: Left Arm, Patient Position: Sitting, Cuff Size: Normal)   Pulse 86   Ht 5' 4.5" (1.638 m)   Wt 171 lb (77.6 kg)   LMP 05/20/2014   SpO2 96%   BMI 28.90 kg/m     General appearance: alert, cooperative and appears stated age Head: normocephalic, without obvious abnormality, atraumatic Neck: no adenopathy, supple, symmetrical, trachea midline and thyroid normal to inspection and palpation Lungs: clear to auscultation bilaterally Breasts: normal appearance, no masses or tenderness, No nipple retraction or dimpling, No nipple discharge or bleeding, No axillary adenopathy Heart: regular rate and rhythm Abdomen: soft, non-tender; no masses, no organomegaly Extremities: extremities normal, atraumatic, no cyanosis or edema Skin: skin color, texture, turgor normal. No rashes or lesions Lymph nodes: cervical, supraclavicular, and axillary nodes normal. Neurologic: grossly normal  Pelvic:  External genitalia:  no lesions              No abnormal inguinal nodes palpated.              Urethra:  normal appearing urethra with no masses, tenderness or lesions              Bartholins and Skenes: normal                 Vagina: normal appearing vagina with normal color and discharge, no lesions              Cervix: no lesions              Pap taken: no Bimanual Exam:  Uterus:  normal size, contour, position, consistency, mobility, non-tender              Adnexa: no mass, fullness, tenderness              Rectal exam: yes.  Confirms.              Anus:  normal sphincter tone, no lesions  Chaperone was present for exam:  Warren Lacy, CMA  Assessment:   Well woman visit with  gynecologic exam. Remote history of cryotherapy. Vaginal atrophy.  Hx of GDM. Hx elevated cholesterol. Hx oral HSV.   PCP prescribing Valtrex.   Plan: Mammogram screening discussed. Self breast awareness reviewed. Pap and HR HPV 2025 Guidelines for Calcium, Vitamin D, regular exercise program including cardiovascular and weight bearing exercise. Refill of Vagifem.  I discussed potential effect on breast cancer.   Follow up annually and prn.

## 2023-03-14 ENCOUNTER — Encounter: Payer: Self-pay | Admitting: Obstetrics and Gynecology

## 2023-03-14 ENCOUNTER — Ambulatory Visit (INDEPENDENT_AMBULATORY_CARE_PROVIDER_SITE_OTHER): Payer: 59 | Admitting: Obstetrics and Gynecology

## 2023-03-14 VITALS — BP 126/86 | HR 86 | Ht 64.5 in | Wt 171.0 lb

## 2023-03-14 DIAGNOSIS — Z01419 Encounter for gynecological examination (general) (routine) without abnormal findings: Secondary | ICD-10-CM | POA: Diagnosis not present

## 2023-03-14 MED ORDER — ESTRADIOL 10 MCG VA TABS: 1.0000 | ORAL_TABLET | VAGINAL | 3 refills | Status: DC

## 2023-03-14 NOTE — Patient Instructions (Signed)

## 2023-05-10 ENCOUNTER — Ambulatory Visit: Payer: 59 | Admitting: Sports Medicine

## 2023-05-10 ENCOUNTER — Other Ambulatory Visit: Payer: Self-pay

## 2023-05-10 VITALS — BP 140/78 | Ht 65.0 in | Wt 168.0 lb

## 2023-05-10 DIAGNOSIS — G5601 Carpal tunnel syndrome, right upper limb: Secondary | ICD-10-CM

## 2023-05-10 MED ORDER — METHYLPREDNISOLONE ACETATE 40 MG/ML IJ SUSP
40.0000 mg | Freq: Once | INTRAMUSCULAR | Status: AC
  Administered 2023-05-10: 40 mg via INTRA_ARTICULAR

## 2023-05-10 NOTE — Progress Notes (Signed)
  Abigail Sawyer - 57 y.o. female MRN [id number]  Date of birth: 1966-07-03    CHIEF COMPLAINT:   Right carpal tunnel    SUBJECTIVE:   HPI:  Pleasant 57 year old female comes to clinic to follow-up right carpal tunnel.  She had a ultrasound-guided carpal tunnel injection back in September.  She got about 8 months of great relief from this.  For the last month or so she has had return of numbness and tingling in the median nerve distribution.  She is return to wearing her night splint but not gotten any relief.  She like to do a repeat ultrasound-guided injection today.  ROS:     See HPI  PERTINENT  PMH / PSH FH / / SH:  Past Medical, Surgical, Social, and Family History Reviewed & Updated in the EMR.  Pertinent findings include:  none  OBJECTIVE: BP (!) 140/78   Ht 5\' 5"  (1.651 m)   Wt 168 lb (76.2 kg)   LMP 05/20/2014   BMI 27.96 kg/m   Physical Exam:  Vital signs are reviewed.  GEN: Alert and oriented, NAD Pulm: Breathing unlabored PSY: normal mood, congruent affect  MSK: R Wrist - no obvious foreign.  No overlying skin changes.  Full range of motion of the wrist in flexion extension, radial, ulnar deviation.  Positive Tinel's.  Neurovascular intact distally.  ASSESSMENT & PLAN:  1.  Right carpal tunnel  -We will proceed with an ultrasound-guided hydrodissection injection of the median nerve at the right carpal tunnel.  Patient tolerated the procedure well.  She can follow-up as needed.  If she is ever getting less than 3 months of relief with these injections, I recommend she have surgery.  Procedure performed:  Right carpal tunnel injection, ultrasound-guided  Consent obtained and verified. Time-out conducted. Noted no overlying erythema, induration, or other signs of local infection. The median nerve was identified with ultrasound. The overlying skin was prepped in a sterile fashion. Topical analgesic spray: Ethyl chloride. Needle:25 G Meds: 2cc of 1% lidocaine  and 40mg  Depomedrol  Using an in-plane approach, the needle tip was inserted from the ulnar aspect of the wrist until the needle tip was visualized next to the median nerve.  The medicine was injected around the nerve and in between the transverse palmar aponeurosis and visualized well on the ultrasound.  Patient tolerated procedure well.  Advised to call if fevers/chills, erythema, induration, drainage, or persistent bleeding.   Arvella Nigh, MD PGY-4, Sports Medicine Fellow [facility]  I observed and examined the patient with the [facility] resident and agree with assessment and plan.  Note reviewed and modified by me. Sterling Big, MD

## 2023-06-03 IMAGING — MG MM DIGITAL SCREENING BILAT W/ TOMO AND CAD
8 series · 8 of 24 positions shown · non-contrast
Comparison: Previous exam(s).

CLINICAL DATA: Screening.

EXAM:
DIGITAL SCREENING BILATERAL MAMMOGRAM WITH TOMOSYNTHESIS AND CAD
TECHNIQUE: Bilateral screening digital craniocaudal and mediolateral oblique
mammograms were obtained. Bilateral screening digital breast
tomosynthesis was performed. The images were evaluated with
computer-aided detection.

[R CC synth-2D]
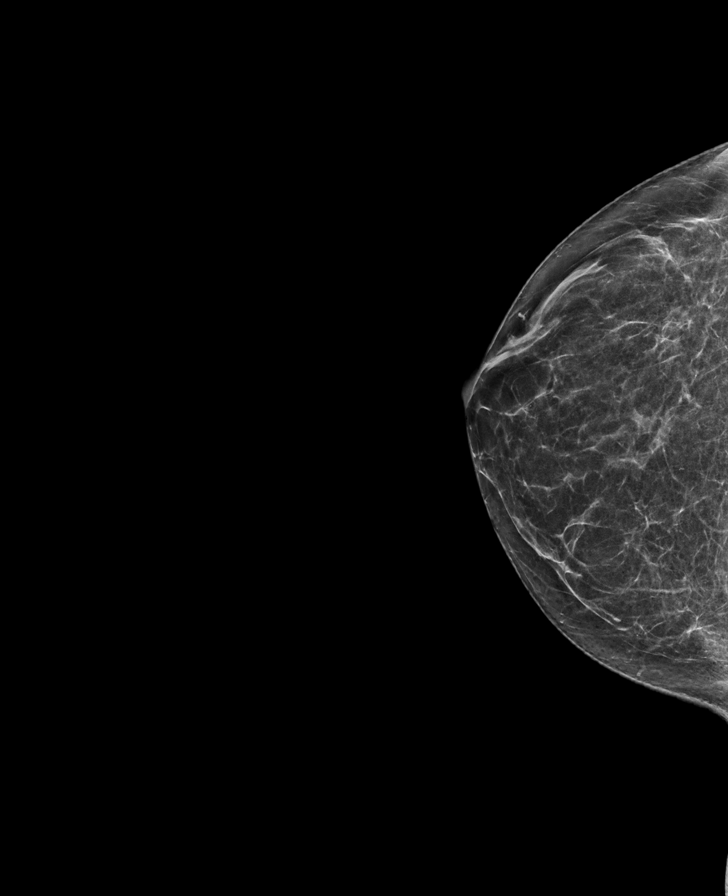

[L MLO synth-2D]
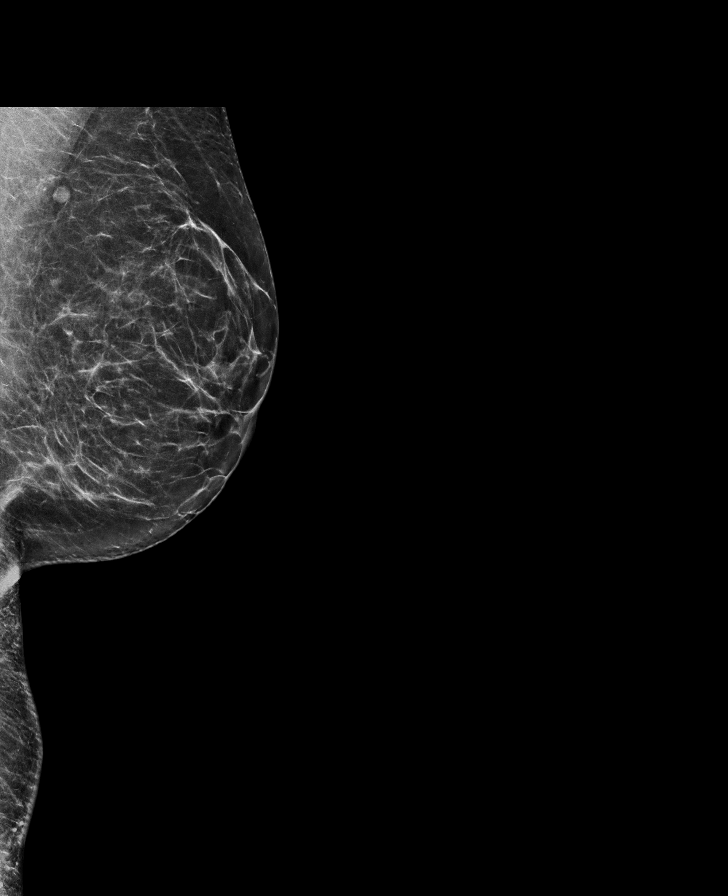

[L CC synth-2D]
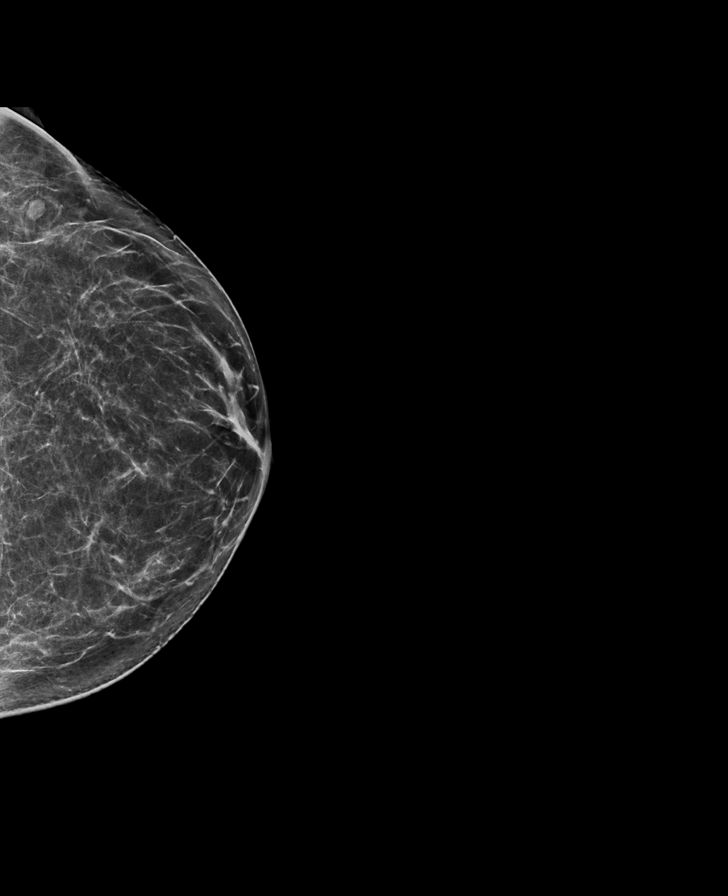

[R MLO synth-2D]
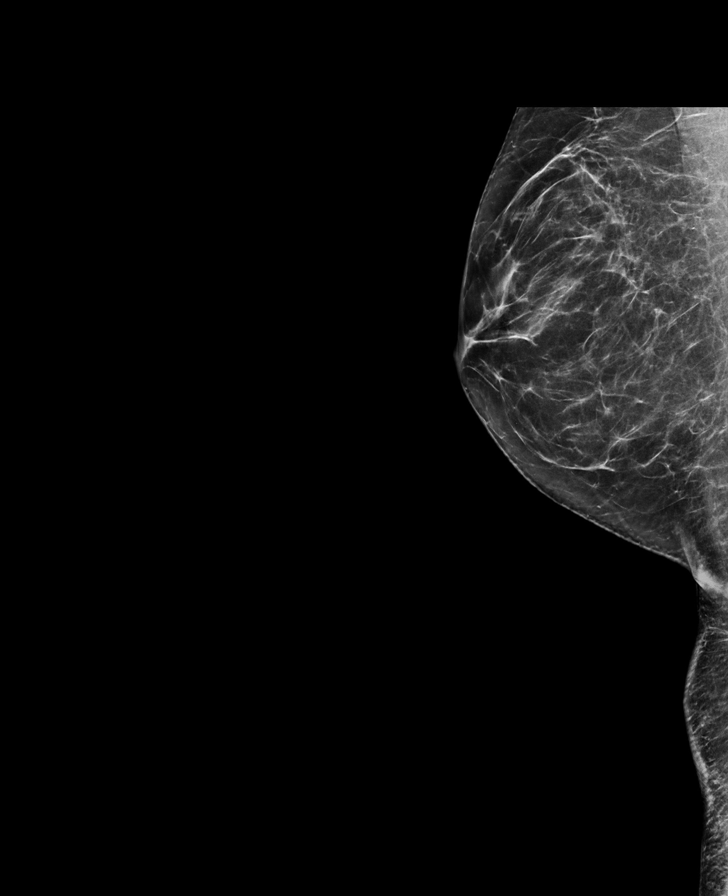

[L MLO tomo · tomo slice 33/66.0]
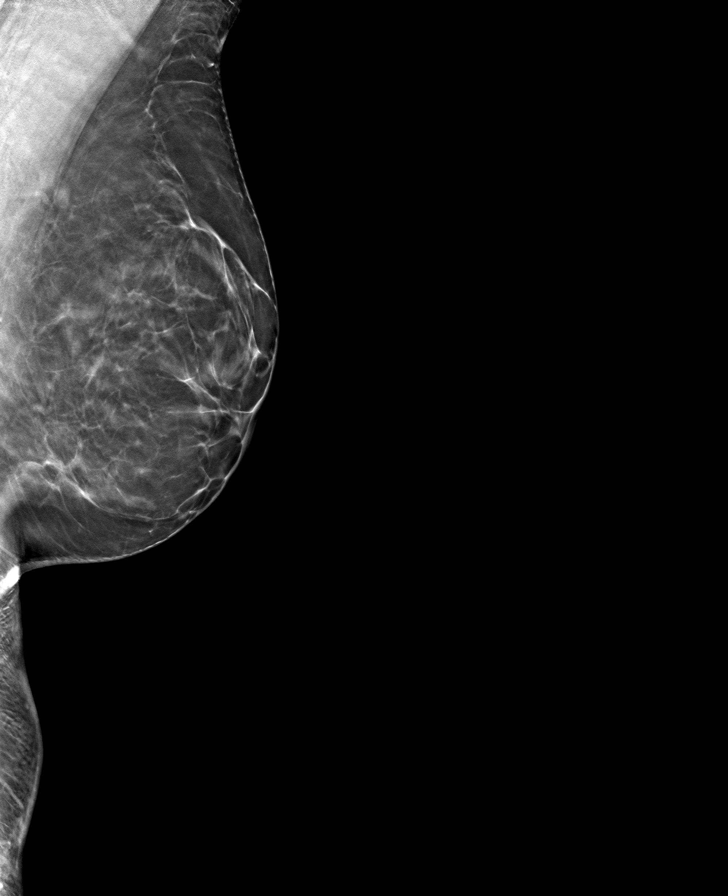

[L CC tomo · tomo slice 33/65.0]
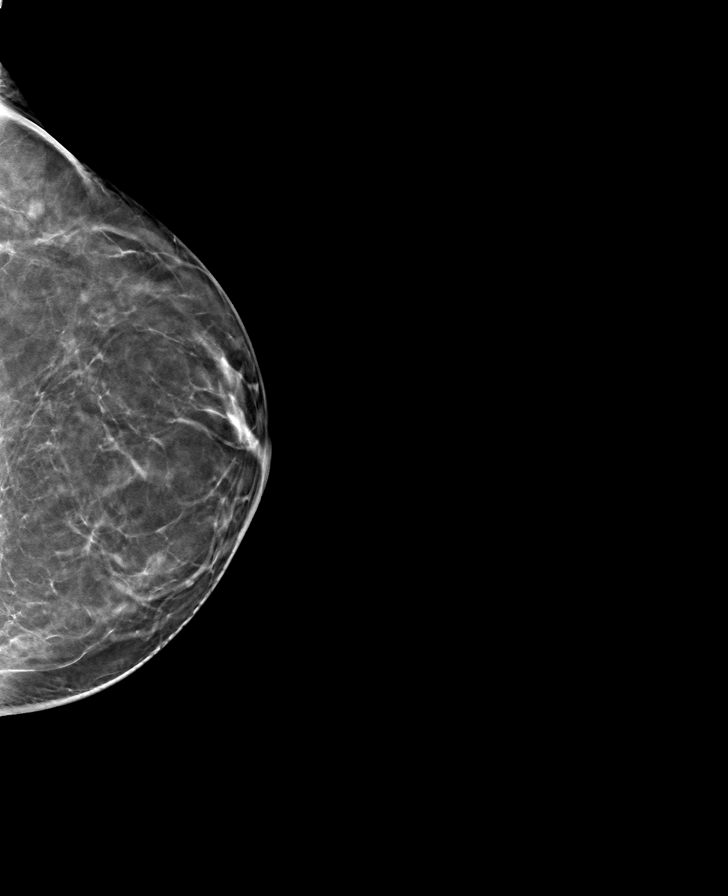

[R CC tomo · tomo slice 31/61.0]
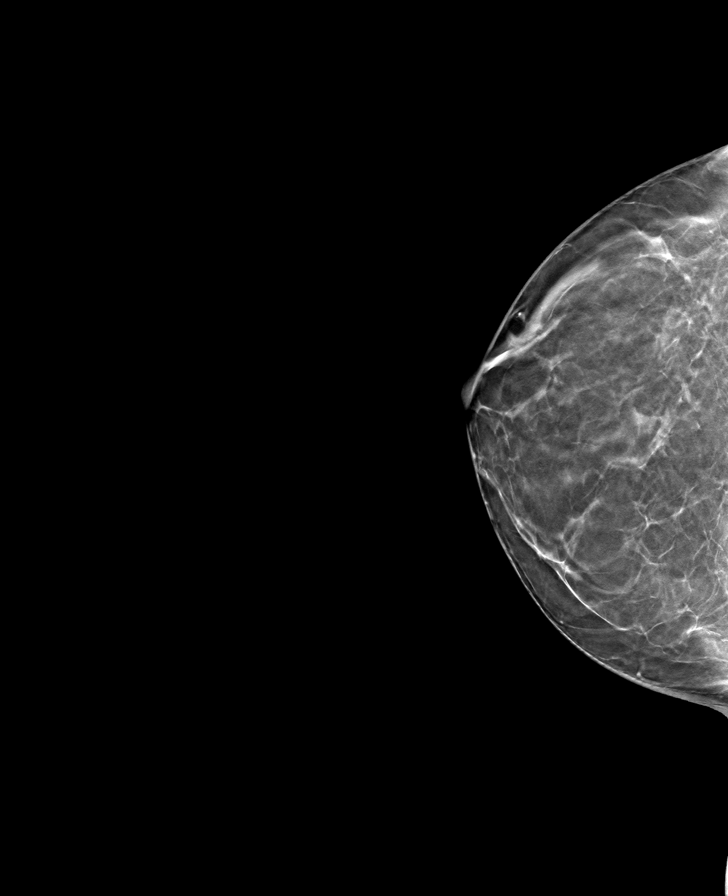

[R MLO tomo · tomo slice 35/68.0]
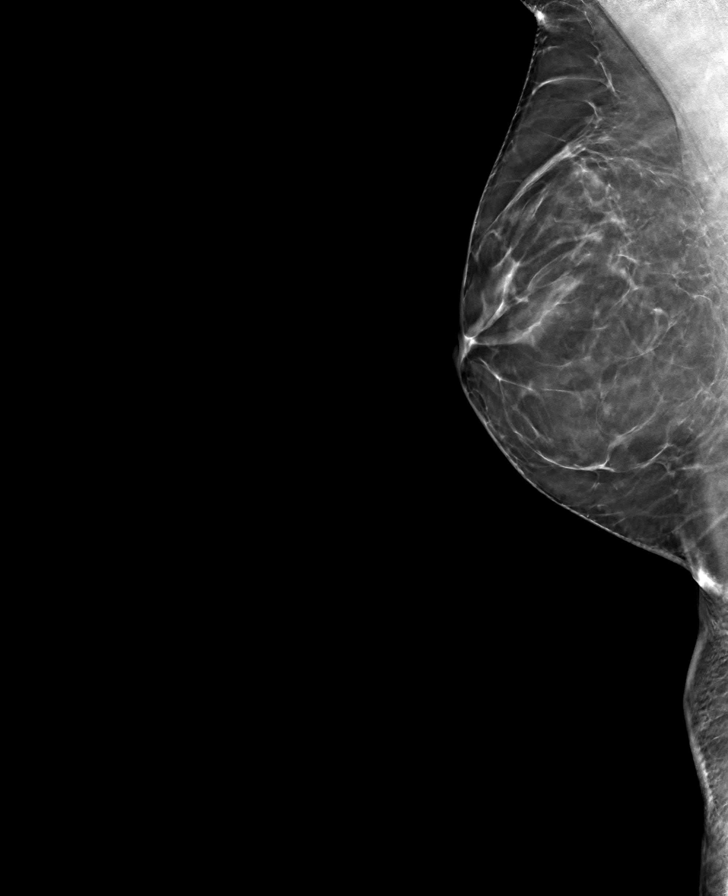

[8 of 24 positions shown; findings below may reference images not displayed]

ACR Breast Density Category b: There are scattered areas of
fibroglandular density.
FINDINGS: There are no findings suspicious for malignancy.
IMPRESSION: No mammographic evidence of malignancy. A result letter of this
screening mammogram will be mailed directly to the patient.

RECOMMENDATION:
Screening mammogram in one year. (Code:51-O-LD2)

BI-RADS CATEGORY  1: Negative.

## 2023-06-18 DIAGNOSIS — F419 Anxiety disorder, unspecified: Secondary | ICD-10-CM | POA: Diagnosis not present

## 2023-06-18 DIAGNOSIS — F439 Reaction to severe stress, unspecified: Secondary | ICD-10-CM | POA: Diagnosis not present

## 2023-06-18 DIAGNOSIS — F5101 Primary insomnia: Secondary | ICD-10-CM | POA: Diagnosis not present

## 2023-06-18 DIAGNOSIS — E7800 Pure hypercholesterolemia, unspecified: Secondary | ICD-10-CM | POA: Diagnosis not present

## 2023-07-10 DIAGNOSIS — E7800 Pure hypercholesterolemia, unspecified: Secondary | ICD-10-CM | POA: Diagnosis not present

## 2023-07-10 DIAGNOSIS — G5601 Carpal tunnel syndrome, right upper limb: Secondary | ICD-10-CM | POA: Diagnosis not present

## 2023-07-10 DIAGNOSIS — F419 Anxiety disorder, unspecified: Secondary | ICD-10-CM | POA: Diagnosis not present

## 2023-07-10 DIAGNOSIS — Z0000 Encounter for general adult medical examination without abnormal findings: Secondary | ICD-10-CM | POA: Diagnosis not present

## 2023-07-10 DIAGNOSIS — Z23 Encounter for immunization: Secondary | ICD-10-CM | POA: Diagnosis not present

## 2023-07-10 DIAGNOSIS — F5101 Primary insomnia: Secondary | ICD-10-CM | POA: Diagnosis not present

## 2023-08-01 DIAGNOSIS — H04123 Dry eye syndrome of bilateral lacrimal glands: Secondary | ICD-10-CM | POA: Diagnosis not present

## 2023-08-01 DIAGNOSIS — H5213 Myopia, bilateral: Secondary | ICD-10-CM | POA: Diagnosis not present

## 2023-09-10 DIAGNOSIS — M79641 Pain in right hand: Secondary | ICD-10-CM | POA: Diagnosis not present

## 2023-09-10 DIAGNOSIS — M65831 Other synovitis and tenosynovitis, right forearm: Secondary | ICD-10-CM | POA: Diagnosis not present

## 2023-09-10 DIAGNOSIS — Z23 Encounter for immunization: Secondary | ICD-10-CM | POA: Diagnosis not present

## 2023-09-10 DIAGNOSIS — G5601 Carpal tunnel syndrome, right upper limb: Secondary | ICD-10-CM | POA: Diagnosis not present

## 2023-09-21 DIAGNOSIS — G5601 Carpal tunnel syndrome, right upper limb: Secondary | ICD-10-CM | POA: Diagnosis not present

## 2023-10-11 DIAGNOSIS — G5601 Carpal tunnel syndrome, right upper limb: Secondary | ICD-10-CM | POA: Diagnosis not present

## 2023-10-23 DIAGNOSIS — M25531 Pain in right wrist: Secondary | ICD-10-CM | POA: Diagnosis not present

## 2023-12-05 ENCOUNTER — Other Ambulatory Visit: Payer: Self-pay | Admitting: Obstetrics and Gynecology

## 2023-12-05 DIAGNOSIS — Z1231 Encounter for screening mammogram for malignant neoplasm of breast: Secondary | ICD-10-CM

## 2023-12-18 ENCOUNTER — Ambulatory Visit
Admission: RE | Admit: 2023-12-18 | Discharge: 2023-12-18 | Disposition: A | Discharge status: Home / Self Care | Payer: 59 | Source: Ambulatory Visit

## 2023-12-18 DIAGNOSIS — Z1231 Encounter for screening mammogram for malignant neoplasm of breast: Secondary | ICD-10-CM | POA: Diagnosis not present

## 2023-12-20 ENCOUNTER — Encounter: Payer: Self-pay | Admitting: Obstetrics and Gynecology

## 2024-02-25 DIAGNOSIS — Z4789 Encounter for other orthopedic aftercare: Secondary | ICD-10-CM | POA: Diagnosis not present

## 2024-02-25 DIAGNOSIS — M65831 Other synovitis and tenosynovitis, right forearm: Secondary | ICD-10-CM | POA: Diagnosis not present

## 2024-02-25 DIAGNOSIS — M25531 Pain in right wrist: Secondary | ICD-10-CM | POA: Diagnosis not present

## 2024-02-25 DIAGNOSIS — G5601 Carpal tunnel syndrome, right upper limb: Secondary | ICD-10-CM | POA: Diagnosis not present

## 2024-04-23 ENCOUNTER — Encounter: Payer: Self-pay | Admitting: Obstetrics and Gynecology

## 2024-04-23 ENCOUNTER — Ambulatory Visit (INDEPENDENT_AMBULATORY_CARE_PROVIDER_SITE_OTHER): Payer: 59 | Admitting: Obstetrics and Gynecology

## 2024-04-23 ENCOUNTER — Other Ambulatory Visit (HOSPITAL_COMMUNITY)
Admission: RE | Admit: 2024-04-23 | Discharge: 2024-04-23 | Disposition: A | Discharge status: Home / Self Care | Source: Ambulatory Visit | Attending: Obstetrics and Gynecology | Admitting: Obstetrics and Gynecology

## 2024-04-23 VITALS — BP 116/76 | HR 81 | Ht 64.5 in | Wt 172.0 lb

## 2024-04-23 DIAGNOSIS — Z1331 Encounter for screening for depression: Secondary | ICD-10-CM | POA: Diagnosis not present

## 2024-04-23 DIAGNOSIS — Z124 Encounter for screening for malignant neoplasm of cervix: Secondary | ICD-10-CM | POA: Insufficient documentation

## 2024-04-23 DIAGNOSIS — Z01419 Encounter for gynecological examination (general) (routine) without abnormal findings: Secondary | ICD-10-CM | POA: Diagnosis not present

## 2024-04-23 MED ORDER — ESTRADIOL 10 MCG VA TABS: 1.0000 | ORAL_TABLET | VAGINAL | 3 refills | Status: AC

## 2024-04-23 NOTE — Patient Instructions (Signed)

## 2024-04-23 NOTE — Progress Notes (Signed)
 58 y.o. [id number] Married Caucasian female here for annual exam.    Using vaginal estradiol  sporadically.   Children have graduated from college.  Doing a home remodeling project.  Mother had Alzheimer's and will move in with patient.   PCP: Faustina Hood, MD   Patient's last menstrual period was 05/20/2014.           Sexually active: Yes.    The current method of family planning is tubal ligation.    Menopausal hormone therapy:  estradiol  vaginal Exercising: Yes.    Tennis, golf, yoga, weight lifting Smoker:  no  OB History  Gravida Para Term Preterm AB Living  4 2 2  2 2   SAB IAB Ectopic Multiple Live Births  1  1      # Outcome Date GA Lbr Len/2nd Weight Sex Type Anes PTL Lv  4 Ectopic           3 SAB           2 Term           1 Term              HEALTH MAINTENANCE: Last 2 paps:  01/20/19 neg HPV neg History of abnormal Pap or positive HPV:  no Mammogram:   12/18/23 Breast Density Cat B, BIRADS Cat 1 neg  Colonoscopy:  03/22/15  Bone Density:  n/a  Result  n/a   Immunization History  Administered Date(s) Administered   Tdap 12/23/2014      reports that she has never smoked. She has never used smokeless tobacco. She reports current alcohol use of about 6.0 - 8.0 standard drinks of alcohol per week. She reports that she does not use drugs.  Past Medical History:  Diagnosis Date   Abnormal Pap smear    in her 79s   Anxiety    Elevated hemoglobin A1c 12/27/2015   5.7   Elevated LDL cholesterol level    History of COVID-19    05/2019, 06/2021   Primary HSV infection of mouth     Past Surgical History:  Procedure Laterality Date   CESAREAN SECTION  2002   ECTOPIC PREGNANCY SURGERY     GYNECOLOGIC CRYOSURGERY     PELVIC LAPAROSCOPY  2001   LSO   TUBAL LIGATION  2002    Current Outpatient Medications  Medication Sig Dispense Refill   calcium gluconate 500 MG tablet Take 1 tablet by mouth daily.     clonazePAM  (KLONOPIN ) 0.5 MG tablet 1 tablet at  bedtime.     Estradiol  10 MCG TABS vaginal tablet Place 1 tablet (10 mcg total) vaginally 2 (two) times a week. 24 tablet 3   LEXAPRO 10 MG tablet      valACYclovir  (VALTREX ) 1000 MG tablet TAKE 2 TABLETS(2000 MG) BY MOUTH EVERY 12 HOURS FOR 24 HOURS 30 tablet 1   No current facility-administered medications for this visit.    ALLERGIES: Patient has no known allergies.  Family History  Problem Relation Age of Onset   Hyperlipidemia Mother    Alzheimer's disease Mother    Parkinson's disease Father    Multiple myeloma Maternal Grandmother    Heart attack Maternal Grandfather     Review of Systems  All other systems reviewed and are negative.   PHYSICAL EXAM:  BP 116/76 (BP Location: Left Arm, Patient Position: Sitting)   Pulse 81   Ht 5' 4.5" (1.638 m)   Wt 172 lb (78 kg)   LMP 05/20/2014  SpO2 96%   BMI 29.07 kg/m     General appearance: alert, cooperative and appears stated age Head: normocephalic, without obvious abnormality, atraumatic Neck: no adenopathy, supple, symmetrical, trachea midline and thyroid normal to inspection and palpation Lungs: clear to auscultation bilaterally Breasts: normal appearance, no masses or tenderness, No nipple retraction or dimpling, No nipple discharge or bleeding, No axillary adenopathy Heart: regular rate and rhythm Abdomen: soft, non-tender; no masses, no organomegaly Extremities: extremities normal, atraumatic, no cyanosis or edema Skin: skin color, texture, turgor normal. No rashes or lesions Lymph nodes: cervical, supraclavicular, and axillary nodes normal. Neurologic: grossly normal  Pelvic: External genitalia:  no lesions              No abnormal inguinal nodes palpated.              Urethra:  normal appearing urethra with no masses, tenderness or lesions              Bartholins and Skenes: normal                 Vagina: normal appearing vagina with normal color and discharge, no lesions              Cervix: no lesions               Pap taken: yes Bimanual Exam:  Uterus:  normal size, contour, position, consistency, mobility, non-tender              Adnexa: no mass, fullness, tenderness              Rectal exam: yes.  Confirms.              Anus:  normal sphincter tone, no lesions  Chaperone was present for exam:  Cottie Diss, CMA  ASSESSMENT: Well woman visit with gynecologic exam. Remote history of cryotherapy. Vaginal atrophy.  Hx of GDM. Hx elevated cholesterol. Hx oral HSV.   PCP prescribing Valtrex .  PHQ-9: 0  PLAN: Mammogram screening discussed. Self breast awareness reviewed. Pap and HRV collected:  yes Guidelines for Calcium, Vitamin D , regular exercise program including cardiovascular and weight bearing exercise. Medication refills:  Vaginal estradiol  tablets.  I discussed potential effect on breast cancer. Labs with PCP.  Follow up:  yearly and prn.

## 2024-04-24 LAB — CYTOLOGY - PAP
Comment: NEGATIVE
Diagnosis: NEGATIVE
High risk HPV: NEGATIVE

## 2024-04-26 ENCOUNTER — Ambulatory Visit: Payer: Self-pay | Admitting: Obstetrics and Gynecology

## 2024-09-08 DIAGNOSIS — E7800 Pure hypercholesterolemia, unspecified: Secondary | ICD-10-CM | POA: Diagnosis not present

## 2024-09-08 DIAGNOSIS — Z0000 Encounter for general adult medical examination without abnormal findings: Secondary | ICD-10-CM | POA: Diagnosis not present

## 2024-11-14 DIAGNOSIS — H43812 Vitreous degeneration, left eye: Secondary | ICD-10-CM | POA: Diagnosis not present

## 2024-11-25 DIAGNOSIS — Z8249 Family history of ischemic heart disease and other diseases of the circulatory system: Secondary | ICD-10-CM

## 2024-12-15 ENCOUNTER — Other Ambulatory Visit (HOSPITAL_BASED_OUTPATIENT_CLINIC_OR_DEPARTMENT_OTHER)

## 2024-12-15 ENCOUNTER — Encounter (HOSPITAL_BASED_OUTPATIENT_CLINIC_OR_DEPARTMENT_OTHER): Payer: Self-pay

## 2024-12-18 ENCOUNTER — Ambulatory Visit (HOSPITAL_BASED_OUTPATIENT_CLINIC_OR_DEPARTMENT_OTHER)
Admission: RE | Admit: 2024-12-18 | Discharge: 2024-12-18 | Disposition: A | Discharge status: Home / Self Care | Payer: Self-pay | Source: Ambulatory Visit | Attending: Family Medicine | Admitting: Family Medicine

## 2024-12-18 DIAGNOSIS — Z8249 Family history of ischemic heart disease and other diseases of the circulatory system: Secondary | ICD-10-CM | POA: Insufficient documentation
